# Patient Record
Sex: Female | Born: 1975 | Race: White | Hispanic: No | Marital: Single | Smoking: Never smoker
Health system: Southern US, Community
[De-identification: ages and names within clinical notes are randomized; demographics above are authoritative.]

## PROBLEM LIST (undated history)

## (undated) DIAGNOSIS — I10 Essential (primary) hypertension: Secondary | ICD-10-CM

## (undated) DIAGNOSIS — F32A Depression, unspecified: Secondary | ICD-10-CM

## (undated) DIAGNOSIS — G4733 Obstructive sleep apnea (adult) (pediatric): Secondary | ICD-10-CM

## (undated) DIAGNOSIS — K219 Gastro-esophageal reflux disease without esophagitis: Secondary | ICD-10-CM

## (undated) DIAGNOSIS — M1990 Unspecified osteoarthritis, unspecified site: Secondary | ICD-10-CM

## (undated) HISTORY — PX: HERNIA REPAIR: SHX51

## (undated) HISTORY — PX: LAPAROSCOPIC GASTRIC SLEEVE RESECTION: SHX5895

## (undated) HISTORY — DX: Obstructive sleep apnea (adult) (pediatric): G47.33

## (undated) HISTORY — PX: CARDIAC CATHETERIZATION: SHX172

---

## 2004-09-12 DIAGNOSIS — K219 Gastro-esophageal reflux disease without esophagitis: Secondary | ICD-10-CM | POA: Insufficient documentation

## 2007-09-08 DIAGNOSIS — G2581 Restless legs syndrome: Secondary | ICD-10-CM | POA: Insufficient documentation

## 2010-08-04 HISTORY — PX: BACK SURGERY: SHX140

## 2020-12-15 DIAGNOSIS — R079 Chest pain, unspecified: Secondary | ICD-10-CM

## 2021-02-13 DIAGNOSIS — Z8249 Family history of ischemic heart disease and other diseases of the circulatory system: Secondary | ICD-10-CM | POA: Insufficient documentation

## 2021-06-10 DIAGNOSIS — Z9889 Other specified postprocedural states: Secondary | ICD-10-CM | POA: Insufficient documentation

## 2021-12-12 ENCOUNTER — Emergency Department (HOSPITAL_COMMUNITY)
Admission: EM | Admit: 2021-12-12 | Discharge: 2021-12-12 | Disposition: A | Discharge status: Home / Self Care | Payer: No Typology Code available for payment source | Attending: Emergency Medicine | Admitting: Emergency Medicine

## 2021-12-12 ENCOUNTER — Emergency Department (HOSPITAL_COMMUNITY): Payer: No Typology Code available for payment source

## 2021-12-12 ENCOUNTER — Other Ambulatory Visit: Payer: Self-pay

## 2021-12-12 ENCOUNTER — Encounter (HOSPITAL_COMMUNITY): Payer: Self-pay

## 2021-12-12 DIAGNOSIS — S52572A Other intraarticular fracture of lower end of left radius, initial encounter for closed fracture: Secondary | ICD-10-CM | POA: Diagnosis not present

## 2021-12-12 DIAGNOSIS — S52612A Displaced fracture of left ulna styloid process, initial encounter for closed fracture: Secondary | ICD-10-CM | POA: Diagnosis not present

## 2021-12-12 DIAGNOSIS — M25572 Pain in left ankle and joints of left foot: Secondary | ICD-10-CM | POA: Insufficient documentation

## 2021-12-12 DIAGNOSIS — M542 Cervicalgia: Secondary | ICD-10-CM | POA: Diagnosis not present

## 2021-12-12 DIAGNOSIS — W19XXXA Unspecified fall, initial encounter: Secondary | ICD-10-CM

## 2021-12-12 DIAGNOSIS — M25552 Pain in left hip: Secondary | ICD-10-CM | POA: Diagnosis not present

## 2021-12-12 DIAGNOSIS — S59912A Unspecified injury of left forearm, initial encounter: Secondary | ICD-10-CM | POA: Diagnosis present

## 2021-12-12 HISTORY — DX: Unspecified osteoarthritis, unspecified site: M19.90

## 2021-12-12 HISTORY — DX: Essential (primary) hypertension: I10

## 2021-12-12 MED ORDER — HYDROMORPHONE HCL 1 MG/ML IJ SOLN
1.0000 mg | Freq: Once | INTRAMUSCULAR | Status: AC
  Administered 2021-12-12: 1 mg via INTRAVENOUS
  Filled 2021-12-12: qty 1

## 2021-12-12 MED ORDER — KETOROLAC TROMETHAMINE 15 MG/ML IJ SOLN
15.0000 mg | Freq: Once | INTRAMUSCULAR | Status: AC
  Administered 2021-12-12: 15 mg via INTRAVENOUS
  Filled 2021-12-12: qty 1

## 2021-12-12 MED ORDER — LIDOCAINE HCL (PF) 1 % IJ SOLN
30.0000 mL | Freq: Once | INTRAMUSCULAR | Status: AC
  Administered 2021-12-12: 30 mL
  Filled 2021-12-12: qty 30

## 2021-12-12 MED ORDER — HYDROMORPHONE HCL 1 MG/ML IJ SOLN
0.5000 mg | Freq: Once | INTRAMUSCULAR | Status: AC
  Administered 2021-12-12: 0.5 mg via INTRAVENOUS
  Filled 2021-12-12: qty 1

## 2021-12-12 MED ORDER — HYDROMORPHONE HCL 1 MG/ML IJ SOLN: 1.0000 mg | Freq: Once | INTRAMUSCULAR | Status: DC
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
## 2021-12-12 NOTE — Discharge Instructions (Addendum)
Please use Tylenol for pain.  You may use 1000 mg of Tylenol every 6 hours.  Not to exceed 4 g of Tylenol within 24 hours.   ? ?You can use the hydrocodone that you have at home for breakthrough pain. ? ?Please call the hand surgeon whose contact information I am providing first thing in the morning or contact the surgeon whom you are already in touch with for your ankle fracture to see if they also have a hand surgeon on staff. ? ?

## 2021-12-12 NOTE — ED Notes (Signed)
Discharge instructions reviewed, questions answered. Sling and walking boot in place. Rx education provided. Pt states understanding and no further questions. Pt wheeled outside upon discharge. No s/s of distress noted. ?
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
## 2021-12-16 ENCOUNTER — Encounter (HOSPITAL_BASED_OUTPATIENT_CLINIC_OR_DEPARTMENT_OTHER): Payer: Self-pay | Admitting: Orthopedic Surgery

## 2021-12-16 ENCOUNTER — Other Ambulatory Visit: Payer: Self-pay | Admitting: Orthopedic Surgery

## 2021-12-16 ENCOUNTER — Other Ambulatory Visit: Payer: Self-pay

## 2021-12-17 NOTE — Progress Notes (Signed)

## 2021-12-19 ENCOUNTER — Encounter (HOSPITAL_BASED_OUTPATIENT_CLINIC_OR_DEPARTMENT_OTHER): Payer: Self-pay | Admitting: Orthopedic Surgery

## 2021-12-19 ENCOUNTER — Ambulatory Visit (HOSPITAL_BASED_OUTPATIENT_CLINIC_OR_DEPARTMENT_OTHER): Payer: No Typology Code available for payment source

## 2021-12-19 ENCOUNTER — Other Ambulatory Visit: Payer: Self-pay

## 2021-12-19 ENCOUNTER — Ambulatory Visit (HOSPITAL_BASED_OUTPATIENT_CLINIC_OR_DEPARTMENT_OTHER): Payer: No Typology Code available for payment source | Admitting: Anesthesiology

## 2021-12-19 ENCOUNTER — Encounter (HOSPITAL_BASED_OUTPATIENT_CLINIC_OR_DEPARTMENT_OTHER)
Admission: RE | Disposition: A | Discharge status: Home / Self Care | Payer: Self-pay | Source: Home / Self Care | Attending: Orthopedic Surgery

## 2021-12-19 ENCOUNTER — Ambulatory Visit (HOSPITAL_BASED_OUTPATIENT_CLINIC_OR_DEPARTMENT_OTHER)
Admission: RE | Admit: 2021-12-19 | Discharge: 2021-12-19 | Disposition: A | Discharge status: Home / Self Care | Payer: No Typology Code available for payment source | Attending: Orthopedic Surgery | Admitting: Orthopedic Surgery

## 2021-12-19 DIAGNOSIS — Z79899 Other long term (current) drug therapy: Secondary | ICD-10-CM | POA: Diagnosis not present

## 2021-12-19 DIAGNOSIS — M19012 Primary osteoarthritis, left shoulder: Secondary | ICD-10-CM | POA: Diagnosis not present

## 2021-12-19 DIAGNOSIS — Z01818 Encounter for other preprocedural examination: Secondary | ICD-10-CM

## 2021-12-19 DIAGNOSIS — Z6841 Body Mass Index (BMI) 40.0 and over, adult: Secondary | ICD-10-CM | POA: Diagnosis not present

## 2021-12-19 DIAGNOSIS — K219 Gastro-esophageal reflux disease without esophagitis: Secondary | ICD-10-CM | POA: Insufficient documentation

## 2021-12-19 DIAGNOSIS — I10 Essential (primary) hypertension: Secondary | ICD-10-CM | POA: Insufficient documentation

## 2021-12-19 DIAGNOSIS — S52572A Other intraarticular fracture of lower end of left radius, initial encounter for closed fracture: Secondary | ICD-10-CM | POA: Insufficient documentation

## 2021-12-19 HISTORY — DX: Depression, unspecified: F32.A

## 2021-12-19 HISTORY — PX: FLEXOR TENOTOMY: SHX6342

## 2021-12-19 HISTORY — PX: OPEN REDUCTION INTERNAL FIXATION (ORIF) DISTAL RADIAL FRACTURE: SHX5989

## 2021-12-19 HISTORY — DX: Gastro-esophageal reflux disease without esophagitis: K21.9

## 2021-12-19 SURGERY — OPEN REDUCTION INTERNAL FIXATION (ORIF) DISTAL RADIUS FRACTURE
Anesthesia: Monitor Anesthesia Care | Site: Wrist | Laterality: Left

## 2021-12-19 MED ORDER — FENTANYL CITRATE (PF) 100 MCG/2ML IJ SOLN
100.0000 ug | Freq: Once | INTRAMUSCULAR | Status: AC
  Administered 2021-12-19: 50 ug via INTRAVENOUS

## 2021-12-19 MED ORDER — FENTANYL CITRATE (PF) 100 MCG/2ML IJ SOLN
INTRAMUSCULAR | Status: AC
  Filled 2021-12-19: qty 2

## 2021-12-19 MED ORDER — FENTANYL CITRATE (PF) 100 MCG/2ML IJ SOLN
INTRAMUSCULAR | Status: AC
Start: 2021-12-19 — End: ?
  Filled 2021-12-19: qty 2

## 2021-12-19 MED ORDER — CLONIDINE HCL (ANALGESIA) 100 MCG/ML EP SOLN
EPIDURAL | Status: DC | PRN
  Administered 2021-12-19: 100 ug

## 2021-12-19 MED ORDER — FENTANYL CITRATE (PF) 100 MCG/2ML IJ SOLN
25.0000 ug | INTRAMUSCULAR | Status: DC | PRN
  Administered 2021-12-19: 25 ug via INTRAVENOUS

## 2021-12-19 MED ORDER — MIDAZOLAM HCL 2 MG/2ML IJ SOLN
INTRAMUSCULAR | Status: AC
  Filled 2021-12-19: qty 2

## 2021-12-19 MED ORDER — ONDANSETRON HCL 4 MG/2ML IJ SOLN
INTRAMUSCULAR | Status: DC | PRN
  Administered 2021-12-19: 4 mg via INTRAVENOUS

## 2021-12-19 MED ORDER — LACTATED RINGERS IV SOLN: INTRAVENOUS | Status: DC

## 2021-12-19 MED ORDER — OXYCODONE HCL 5 MG/5ML PO SOLN: 5.0000 mg | Freq: Once | ORAL | Status: DC | PRN

## 2021-12-19 MED ORDER — DROPERIDOL 2.5 MG/ML IJ SOLN: 0.6250 mg | Freq: Once | INTRAMUSCULAR | Status: DC | PRN

## 2021-12-19 MED ORDER — OXYCODONE-ACETAMINOPHEN 5-325 MG PO TABS: ORAL_TABLET | ORAL | 0 refills | Status: DC

## 2021-12-19 MED ORDER — MIDAZOLAM HCL 5 MG/5ML IJ SOLN
INTRAMUSCULAR | Status: DC | PRN
  Administered 2021-12-19: 2 mg via INTRAVENOUS

## 2021-12-19 MED ORDER — 0.9 % SODIUM CHLORIDE (POUR BTL) OPTIME
TOPICAL | Status: DC | PRN
  Administered 2021-12-19: 90 mL

## 2021-12-19 MED ORDER — OXYCODONE HCL 5 MG PO TABS: 5.0000 mg | ORAL_TABLET | Freq: Once | ORAL | Status: DC | PRN

## 2021-12-19 MED ORDER — ACETAMINOPHEN 500 MG PO TABS
ORAL_TABLET | ORAL | Status: AC
Start: 2021-12-19 — End: ?
  Filled 2021-12-19: qty 2

## 2021-12-19 MED ORDER — PROPOFOL 10 MG/ML IV BOLUS
INTRAVENOUS | Status: DC | PRN
  Administered 2021-12-19: 50 mg via INTRAVENOUS

## 2021-12-19 MED ORDER — BUPIVACAINE-EPINEPHRINE (PF) 0.5% -1:200000 IJ SOLN
INTRAMUSCULAR | Status: DC | PRN
  Administered 2021-12-19: 30 mL via PERINEURAL

## 2021-12-19 MED ORDER — PROPOFOL 500 MG/50ML IV EMUL
INTRAVENOUS | Status: DC | PRN
  Administered 2021-12-19: 50 ug/kg/min via INTRAVENOUS

## 2021-12-19 MED ORDER — DEXMEDETOMIDINE (PRECEDEX) IN NS 20 MCG/5ML (4 MCG/ML) IV SYRINGE
PREFILLED_SYRINGE | INTRAVENOUS | Status: DC | PRN
  Administered 2021-12-19: 12 ug via INTRAVENOUS

## 2021-12-19 MED ORDER — FENTANYL CITRATE (PF) 100 MCG/2ML IJ SOLN
INTRAMUSCULAR | Status: DC | PRN
  Administered 2021-12-19: 25 ug via INTRAVENOUS
  Administered 2021-12-19: 50 ug via INTRAVENOUS
  Administered 2021-12-19: 25 ug via INTRAVENOUS

## 2021-12-19 MED ORDER — CEFAZOLIN SODIUM-DEXTROSE 2-4 GM/100ML-% IV SOLN
2.0000 g | INTRAVENOUS | Status: AC
  Administered 2021-12-19: 2 g via INTRAVENOUS

## 2021-12-19 MED ORDER — ACETAMINOPHEN 500 MG PO TABS
1000.0000 mg | ORAL_TABLET | Freq: Once | ORAL | Status: AC
  Administered 2021-12-19: 1000 mg via ORAL

## 2021-12-19 MED ORDER — MIDAZOLAM HCL 2 MG/2ML IJ SOLN
2.0000 mg | Freq: Once | INTRAMUSCULAR | Status: AC
  Administered 2021-12-19: 1 mg via INTRAVENOUS

## 2021-12-19 MED ORDER — LIDOCAINE 2% (20 MG/ML) 5 ML SYRINGE
INTRAMUSCULAR | Status: AC
  Filled 2021-12-19: qty 5

## 2021-12-19 MED ORDER — CEFAZOLIN SODIUM-DEXTROSE 2-4 GM/100ML-% IV SOLN
INTRAVENOUS | Status: AC
  Filled 2021-12-19: qty 100

## 2021-12-19 SURGICAL SUPPLY — 65 items
APL PRP STRL LF DISP 70% ISPRP (MISCELLANEOUS) ×1
BIT DRILL 2.0 LNG QUCK RELEASE (BIT) IMPLANT
BIT DRILL QC 2.8X5 (BIT) ×1 IMPLANT
BLADE SURG 15 STRL LF DISP TIS (BLADE) ×2 IMPLANT
BLADE SURG 15 STRL SS (BLADE) ×4
BNDG CMPR 9X4 STRL LF SNTH (GAUZE/BANDAGES/DRESSINGS) ×1
BNDG ELASTIC 3X5.8 VLCR STR LF (GAUZE/BANDAGES/DRESSINGS) ×2 IMPLANT
BNDG ESMARK 4X9 LF (GAUZE/BANDAGES/DRESSINGS) ×2 IMPLANT
BNDG GAUZE ELAST 4 BULKY (GAUZE/BANDAGES/DRESSINGS) ×2 IMPLANT
BNDG PLASTER X FAST 3X3 WHT LF (CAST SUPPLIES) ×20 IMPLANT
BNDG PLSTR 9X3 FST ST WHT (CAST SUPPLIES) ×10
CHLORAPREP W/TINT 26 (MISCELLANEOUS) ×2 IMPLANT
CORD BIPOLAR FORCEPS 12FT (ELECTRODE) ×2 IMPLANT
COVER BACK TABLE 60X90IN (DRAPES) ×2 IMPLANT
COVER MAYO STAND STRL (DRAPES) ×2 IMPLANT
CUFF TOURN SGL QUICK 18X4 (TOURNIQUET CUFF) IMPLANT
CUFF TOURN SGL QUICK 24 (TOURNIQUET CUFF)
CUFF TRNQT CYL 24X4X16.5-23 (TOURNIQUET CUFF) IMPLANT
DRAPE EXTREMITY T 121X128X90 (DISPOSABLE) ×2 IMPLANT
DRAPE OEC MINIVIEW 54X84 (DRAPES) ×2 IMPLANT
DRAPE SURG 17X23 STRL (DRAPES) ×1 IMPLANT
DRILL 2.0 LNG QUICK RELEASE (BIT) ×2
GAUZE SPONGE 4X4 12PLY STRL (GAUZE/BANDAGES/DRESSINGS) ×2 IMPLANT
GAUZE XEROFORM 1X8 LF (GAUZE/BANDAGES/DRESSINGS) ×2 IMPLANT
GLOVE BIO SURGEON STRL SZ7 (GLOVE) ×1 IMPLANT
GLOVE BIO SURGEON STRL SZ7.5 (GLOVE) ×3 IMPLANT
GLOVE BIOGEL PI IND STRL 7.0 (GLOVE) IMPLANT
GLOVE BIOGEL PI IND STRL 8 (GLOVE) ×1 IMPLANT
GLOVE BIOGEL PI IND STRL 8.5 (GLOVE) IMPLANT
GLOVE BIOGEL PI INDICATOR 7.0 (GLOVE) ×3
GLOVE BIOGEL PI INDICATOR 8 (GLOVE) ×1
GLOVE BIOGEL PI INDICATOR 8.5 (GLOVE) ×1
GLOVE SURG ORTHO 8.0 STRL STRW (GLOVE) ×2 IMPLANT
GLOVE SURG SS PI 7.0 STRL IVOR (GLOVE) ×1 IMPLANT
GOWN STRL REUS W/ TWL LRG LVL3 (GOWN DISPOSABLE) ×1 IMPLANT
GOWN STRL REUS W/TWL LRG LVL3 (GOWN DISPOSABLE) ×4
GOWN STRL REUS W/TWL XL LVL3 (GOWN DISPOSABLE) ×3 IMPLANT
GUIDEWIRE ORTHO 0.054X6 (WIRE) ×3 IMPLANT
MAT PREVALON FULL STRYKER (MISCELLANEOUS) ×1 IMPLANT
NDL HYPO 25X1 1.5 SAFETY (NEEDLE) IMPLANT
NEEDLE HYPO 25X1 1.5 SAFETY (NEEDLE) IMPLANT
NS IRRIG 1000ML POUR BTL (IV SOLUTION) ×2 IMPLANT
PACK BASIN DAY SURGERY FS (CUSTOM PROCEDURE TRAY) ×2 IMPLANT
PAD CAST 3X4 CTTN HI CHSV (CAST SUPPLIES) ×1 IMPLANT
PADDING CAST COTTON 3X4 STRL (CAST SUPPLIES) ×2
PLATE PROX NARROW LEFT (Plate) ×1 IMPLANT
SCREW CORT FT 20X2.3XLCK HEX (Screw) IMPLANT
SCREW CORT FT 22X2.3XLCK HEX (Screw) IMPLANT
SCREW CORTICAL LOCKING 2.3X20M (Screw) ×8 IMPLANT
SCREW CORTICAL LOCKING 2.3X22M (Screw) ×6 IMPLANT
SCREW FX20X2.3XSMTH LCK NS CRT (Screw) IMPLANT
SCREW FX22X2.3XLCK SMTH NS CRT (Screw) IMPLANT
SCREW HEXALOBE NON-LOCK 3.5X14 (Screw) ×1 IMPLANT
SCREW NLCKG 13 3.5X13 HEXA (Screw) IMPLANT
SCREW NON-LOCK 3.5X13 (Screw) ×2 IMPLANT
SCREW NONLOCK HEX 3.5X12 (Screw) ×1 IMPLANT
SLEEVE SCD COMPRESS KNEE MED (STOCKING) IMPLANT
SLING ARM FOAM STRAP LRG (SOFTGOODS) ×1 IMPLANT
STOCKINETTE 4X48 STRL (DRAPES) ×2 IMPLANT
SUT ETHILON 4 0 PS 2 18 (SUTURE) ×3 IMPLANT
SUT VICRYL 4-0 PS2 18IN ABS (SUTURE) ×2 IMPLANT
SYR BULB EAR ULCER 3OZ GRN STR (SYRINGE) ×2 IMPLANT
SYR CONTROL 10ML LL (SYRINGE) IMPLANT
TOWEL GREEN STERILE FF (TOWEL DISPOSABLE) ×4 IMPLANT
UNDERPAD 30X36 HEAVY ABSORB (UNDERPADS AND DIAPERS) ×2 IMPLANT

## 2021-12-19 NOTE — Discharge Instructions (Addendum)
[facility] Instructions Hand Surgery  Wound Care: Keep your hand elevated above the level of your heart.  Do not allow it to dangle by your side.  Keep the dressing dry and do not remove it unless your doctor advises you to do so.  He will usually change it at the time of your post-op visit.  Moving your fingers is advised to stimulate circulation but will depend on the site of your surgery.  If you have a splint applied, your doctor will advise you regarding movement.  Activity: Do not drive or operate machinery today.  Rest today and then you may return to your normal activity and work as indicated by your physician.  Diet:  Drink liquids today or eat a light diet.  You may resume a regular diet tomorrow.    General expectations: Pain for two to three days. Fingers may become slightly swollen.  Call your doctor if any of the following occur: Severe pain not relieved by pain medication. Elevated temperature. Dressing soaked with blood. Inability to move fingers. Erich or bluish color to fingers.    Post Anesthesia Home Care Instructions  Activity: Get plenty of rest for the remainder of the day. A responsible individual must stay with you for 24 hours following the procedure.  For the next 24 hours, DO NOT: -Drive a car -Paediatric nurse -Drink alcoholic beverages -Take any medication unless instructed by your physician -Make any legal decisions or sign important papers.  Meals: Start with liquid foods such as gelatin or soup. Progress to regular foods as tolerated. Avoid greasy, spicy, heavy foods. If nausea and/or vomiting occur, drink only clear liquids until the nausea and/or vomiting subsides. Call your physician if vomiting continues.  Special Instructions/Symptoms: Your throat may feel dry or sore from the anesthesia or the breathing tube placed in your throat during surgery. If this causes discomfort, gargle with warm salt water. The discomfort should disappear  within 24 hours.  If you had a scopolamine patch placed behind your ear for the management of post- operative nausea and/or vomiting:  1. The medication in the patch is effective for 72 hours, after which it should be removed.  Wrap patch in a tissue and discard in the trash. Wash hands thoroughly with soap and water. 2. You may remove the patch earlier than 72 hours if you experience unpleasant side effects which may include dry mouth, dizziness or visual disturbances. 3. Avoid touching the patch. Wash your hands with soap and water after contact with the patch.   Regional Anesthesia Blocks  1. Numbness or the inability to move the "blocked" extremity may last from 3-48 hours after placement. The length of time depends on the medication injected and your individual response to the medication. If the numbness is not going away after 48 hours, call your surgeon.  2. The extremity that is blocked will need to be protected until the numbness is gone and the  Strength has returned. Because you cannot feel it, you will need to take extra care to avoid injury. Because it may be weak, you may have difficulty moving it or using it. You may not know what position it is in without looking at it while the block is in effect.  3. For blocks in the legs and feet, returning to weight bearing and walking needs to be done carefully. You will need to wait until the numbness is entirely gone and the strength has returned. You should be able to move your leg  and foot normally before you try and bear weight or walk. You will need someone to be with you when you first try to ensure you do not fall and possibly risk injury.  4. Bruising and tenderness at the needle site are common side effects and will resolve in a few days.  5. Persistent numbness or new problems with movement should be communicated to the surgeon or the [facility] [phone] [place] [phone]).      Next dose  of Tylenol can be given at 4:15pm if needed.
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
## 2021-12-19 NOTE — Progress Notes (Signed)
Assisted Dr. Howze with left, supraclavicular, ultrasound guided block. Side rails up, monitors on throughout procedure. See vital signs in flow sheet. Tolerated Procedure well. ?

## 2021-12-19 NOTE — Transfer of Care (Signed)
Immediate Anesthesia Transfer of Care Note  Patient: Djeneba Barsch  Procedure(s) Performed: OPEN REDUCTION INTERNAL FIXATION (ORIF) LEFT DISTAL RADIUS FRACTURE (Left: Wrist) LEFT BRACHIORADIALIS RELEASE (Left)  Patient Location: PACU  Anesthesia Type:MAC combined with regional for post-op pain  Level of Consciousness: awake  Airway & Oxygen Therapy: Patient Spontanous Breathing and Patient connected to face mask oxygen  Post-op Assessment: Report given to RN and Post -op Vital signs reviewed and stable  Post vital signs: Reviewed and stable  Last Vitals:  Vitals Value Taken Time  BP    Temp    Pulse 85 12/19/21 1233  Resp 25 12/19/21 1233  SpO2 97 % 12/19/21 1233  Vitals shown include unvalidated device data.  Last Pain:  Vitals:   12/19/21 0940  PainSc: 4       Patients Stated Pain Goal: 8 (93/71/69 [id number])  Complications: No notable events documented.

## 2021-12-19 NOTE — Op Note (Signed)
I assisted Surgeon(s) and Role:    * Betha Loa, MD - Primary    Cindee Salt, MD - Assisting on the Procedure(s): OPEN REDUCTION INTERNAL FIXATION (ORIF) LEFT DISTAL RADIUS FRACTURE LEFT BRACHIORADIALIS RELEASE on 12/19/2021.  I provided assistance on this case as follows: Set up, approach, identification and release of the brachial radialis tendon, identification of the fracture, debridement of the fracture, reduction of the fracture, stabilization of the fracture with plate and screws, which are the wound with application of the dressing and splint.  Electronically signed by: Cindee Salt, MD Date: 12/19/2021 Time: 12:26 PM
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
## 2021-12-19 NOTE — Anesthesia Procedure Notes (Signed)
Anesthesia Regional Block: Supraclavicular block   Pre-Anesthetic Checklist: , timeout performed,  Correct Patient, Correct Site, Correct Laterality,  Correct Procedure, Correct Position, site marked,  Risks and benefits discussed,  Pre-op evaluation,  At surgeon's request and post-op pain management  Laterality: Left  Prep: Maximum Sterile Barrier Precautions used, chloraprep       Needles:  Injection technique: Single-shot  Needle Type: Echogenic Stimulator Needle     Needle Length: 9cm  Needle Gauge: 22     Additional Needles:   Procedures:,,,, ultrasound used (permanent image in chart),,    Narrative:  Start time: 12/19/2021 9:58 AM End time: 12/19/2021 10:01 AM Injection made incrementally with aspirations every 5 mL.  Performed by: Personally  Anesthesiologist: Kaylyn Layer, MD  Additional Notes: Risks, benefits, and alternative discussed. Patient gave consent for procedure. Patient prepped and draped in sterile fashion. Sedation administered, patient remains easily responsive to voice. Relevant anatomy identified with ultrasound guidance. Local anesthetic given in 5cc increments with no signs or symptoms of intravascular injection. No pain or paraesthesias with injection. Patient monitored throughout procedure with signs of LAST or immediate complications. Tolerated well. Ultrasound image placed in chart.  Amalia Greenhouse, MD

## 2021-12-19 NOTE — Anesthesia Preprocedure Evaluation (Addendum)
Anesthesia Evaluation  Patient identified by MRN, date of birth, ID band Patient awake    Reviewed: Allergy & Precautions, NPO status , Patient's Chart, lab work & pertinent test results, reviewed documented beta blocker date and time   History of Anesthesia Complications Negative for: history of anesthetic complications  Airway Mallampati: II  TM Distance: >3 FB Neck ROM: Full    Dental no notable dental hx.    Pulmonary neg pulmonary ROS,    Pulmonary exam normal        Cardiovascular hypertension, Pt. on medications and Pt. on home beta blockers Normal cardiovascular exam     Neuro/Psych Depression negative neurological ROS     GI/Hepatic Neg liver ROS, GERD  Medicated and Controlled,S/p gastric sleeve   Endo/Other  Morbid obesity  Renal/GU negative Renal ROS  negative genitourinary   Musculoskeletal  (+) Arthritis , LEFT DISTAL RADIUS FRACTURE   Abdominal   Peds  Hematology negative hematology ROS (+)   Anesthesia Other Findings Day of surgery medications reviewed with patient.  Reproductive/Obstetrics negative OB ROS                            Anesthesia Physical Anesthesia Plan  ASA: 3  Anesthesia Plan: MAC and Regional   Post-op Pain Management: Tylenol PO (pre-op)*   Induction:   PONV Risk Score and Plan: 2 and Treatment may vary due to age or medical condition, Midazolam, Ondansetron and Propofol infusion  Airway Management Planned: Natural Airway and Simple Face Mask  Additional Equipment: None  Intra-op Plan:   Post-operative Plan:   Informed Consent: I have reviewed the patients History and Physical, chart, labs and discussed the procedure including the risks, benefits and alternatives for the proposed anesthesia with the patient or authorized representative who has indicated his/her understanding and acceptance.       Plan Discussed with: CRNA  Anesthesia  Plan Comments:        Anesthesia Quick Evaluation

## 2021-12-20 ENCOUNTER — Encounter (HOSPITAL_BASED_OUTPATIENT_CLINIC_OR_DEPARTMENT_OTHER): Payer: Self-pay | Admitting: Orthopedic Surgery

## 2021-12-20 NOTE — Anesthesia Postprocedure Evaluation (Signed)
Anesthesia Post Note  Patient: Natalie Hoffman  Procedure(s) Performed: OPEN REDUCTION INTERNAL FIXATION (ORIF) LEFT DISTAL RADIUS FRACTURE (Left: Wrist) LEFT BRACHIORADIALIS RELEASE (Left: Wrist)     Patient location during evaluation: PACU Anesthesia Type: Regional and MAC Level of consciousness: awake and alert Pain management: pain level controlled Vital Signs Assessment: post-procedure vital signs reviewed and stable Respiratory status: spontaneous breathing, nonlabored ventilation, respiratory function stable and patient connected to nasal cannula oxygen Cardiovascular status: stable and blood pressure returned to baseline Postop Assessment: no apparent nausea or vomiting Anesthetic complications: no   No notable events documented.  Last Vitals:  Vitals:   12/19/21 1315 12/19/21 1332  BP: 128/83 (!) 152/90  Pulse: 76 69  Resp: 20 16  Temp:  36.5 C  SpO2: 93% 95%    Last Pain:  Vitals:   12/19/21 1332  PainSc: 0-No pain   Pain Goal: Patients Stated Pain Goal: 8 (12/19/21 0940)                 [place]

## 2021-12-31 ENCOUNTER — Encounter: Payer: Self-pay | Admitting: Physician Assistant

## 2021-12-31 ENCOUNTER — Ambulatory Visit (INDEPENDENT_AMBULATORY_CARE_PROVIDER_SITE_OTHER): Payer: No Typology Code available for payment source | Admitting: Physician Assistant

## 2021-12-31 VITALS — BP 162/110 | HR 87 | Temp 97.4°F | Ht 62.0 in | Wt 252.0 lb

## 2021-12-31 DIAGNOSIS — R899 Unspecified abnormal finding in specimens from other organs, systems and tissues: Secondary | ICD-10-CM | POA: Diagnosis not present

## 2021-12-31 DIAGNOSIS — F341 Dysthymic disorder: Secondary | ICD-10-CM | POA: Insufficient documentation

## 2021-12-31 DIAGNOSIS — F339 Major depressive disorder, recurrent, unspecified: Secondary | ICD-10-CM

## 2021-12-31 DIAGNOSIS — I10 Essential (primary) hypertension: Secondary | ICD-10-CM

## 2021-12-31 MED ORDER — LOSARTAN POTASSIUM 50 MG PO TABS: 50.0000 mg | ORAL_TABLET | Freq: Every day | ORAL | 1 refills | Status: DC

## 2021-12-31 MED ORDER — VENLAFAXINE HCL ER 37.5 MG PO CP24: 37.5000 mg | ORAL_CAPSULE | Freq: Every day | ORAL | 1 refills | Status: DC

## 2021-12-31 NOTE — Progress Notes (Signed)
New Patient Office Visit  Subjective:  Patient ID: Natalie Hoffman, female    DOB: [birth year]  Age: 45 y.o. MRN: [id number]  CC:   Chief Complaint  Patient presents with   Depression hypertension    HPI Natalie Hoffman presents for hypertension.  She states she has had hypertension for several years.  She is supposed to be taking labatelol $RemoveBeforeDE'200mg'cLkwTsjAAcGOtuW$  pm and $Remo'100mg'RvSWC$  in am but she states she never takes the morning dose and bp remains elevated She denies chest pain/sob/edema  Pt with complaints of depressive symptoms.  She scored 15 on PHQ - she has tried several medications in the past including lexapro, paxil, cymbalta, amitriptyline and wellbutrin.  Cannot recall if any of these medications helped. States that she feels se has had depressed mood and crying at times  Pt seeing ortho at this time for right ankle fracture on 5/5 from a fall and then had another fall on 5/11 and fractured left wrist  Pt states awhile back she was told she may need thyroid panel rechecked for possible abnormality and be checked for thyroid disorder - family history of hypothyroidism  Past Medical History:  Diagnosis Date   Arthritis    Depression    GERD (gastroesophageal reflux disease)    Hypertension     Past Surgical History:  Procedure Laterality Date   BACK SURGERY  2012   CARDIAC CATHETERIZATION     negative study no CAD   CESAREAN SECTION     FLEXOR TENOTOMY  Left 12/19/2021   Procedure: LEFT BRACHIORADIALIS RELEASE;  Surgeon: Leanora Cover, MD;  Location: [place];  Service: Orthopedics;  Laterality: Left;  Block   HERNIA REPAIR     LAPAROSCOPIC GASTRIC SLEEVE RESECTION     OPEN REDUCTION INTERNAL FIXATION (ORIF) DISTAL RADIAL FRACTURE Left 12/19/2021   Procedure: OPEN REDUCTION INTERNAL FIXATION (ORIF) LEFT DISTAL RADIUS FRACTURE;  Surgeon: Leanora Cover, MD;  Location: [place];  Service: Orthopedics;  Laterality: Left;  Block    Family  History  Problem Relation Age of Onset   Arthritis Mother    Hypertension Mother    Cancer Mother        VULVAR CANCER   Diabetes Mother    Hyperlipidemia Mother    Hypothyroidism Mother    Arthritis Father    Hypertension Father    Hyperlipidemia Father    Breast cancer Maternal Aunt    Heart attack Paternal Grandfather     Social History   Socioeconomic History   Marital status: Single    Spouse name: Not on file   Number of children: 1   Years of education: Not on file   Highest education level: Not on file  Occupational History   Not on file  Tobacco Use   Smoking status: Never   Smokeless tobacco: Never  Vaping Use   Vaping Use: Never used  Substance and Sexual Activity   Alcohol use: Yes    Comment: OCCASIONAL   Drug use: Never   Sexual activity: Not on file  Other Topics Concern   Not on file  Social History Narrative   Not on file   Social Determinants of Health   Financial Resource Strain: Not on file  Food Insecurity: Not on file  Transportation Needs: Not on file  Physical Activity: Not on file  Stress: Not on file  Social Connections: Not on file  Intimate Partner Violence: Not on file     Current Outpatient Medications:  cetirizine (ZYRTEC) 10 MG tablet, Take 10 mg by mouth daily., Disp: , Rfl:    clobetasol (TEMOVATE) 0.05 % external solution, Apply topically., Disp: , Rfl:    esomeprazole (NEXIUM) 40 MG capsule, Take 40 mg by mouth daily at 12 noon., Disp: , Rfl:    ferrous sulfate 325 (65 FE) MG EC tablet, Take 325 mg by mouth 3 (three) times daily with meals., Disp: , Rfl:    gabapentin (NEURONTIN) 300 MG capsule, Take by mouth., Disp: , Rfl:    Multiple Vitamins-Minerals (BARIATRIC MULTIVITAMINS/IRON) CAPS, Take by mouth., Disp: , Rfl:    pramipexole (MIRAPEX) 0.5 MG tablet, Take 0.5 mg by mouth 3 (three) times daily., Disp: , Rfl:    Ursodiol (URSO 250 PO), Take by mouth., Disp: , Rfl:    Vitamin D, Ergocalciferol, (DRISDOL) 1.25 MG  (50000 UNIT) CAPS capsule, Take 50,000 Units by mouth once a week., Disp: , Rfl:    losartan (COZAAR) 50 MG tablet, Take 1 tablet (50 mg total) by mouth daily., Disp: 30 tablet, Rfl: 1   oxyCODONE-acetaminophen (PERCOCET) 5-325 MG tablet, 1-2 tabs PO q6 hours prn pain (Patient not taking: Reported on 12/31/2021), Disp: 25 tablet, Rfl: 0   venlafaxine XR (EFFEXOR XR) 37.5 MG 24 hr capsule, Take 1 capsule (37.5 mg total) by mouth daily with breakfast., Disp: 30 capsule, Rfl: 1   Allergies  Allergen Reactions   Penicillins Rash    ROS CONSTITUTIONAL: Negative for chills, fatigue, fever,  E/N/T: Negative for ear pain, nasal congestion and sore throat.  CARDIOVASCULAR: Negative for chest pain, dizziness, palpitations and pedal edema.  RESPIRATORY: Negative for recent cough and dyspnea.  MSK: see HPI INTEGUMENTARY: Negative for rash.  PSYCHIATRIC:see HPI    Objective:    PHYSICAL EXAM:   VS: BP (!) 162/110 (BP Location: Right Arm, Patient Position: Sitting)   Pulse 87   Temp (!) 97.4 F (36.3 C) (Temporal)   Ht $R'5\' 2"'Pb$  (1.575 m)   Wt 252 lb (114.3 kg)   SpO2 99%   BMI 46.09 kg/m   GEN: Well nourished, well developed, in no acute distress -  Cardiac: RRR; no murmurs, rubs, or gallops,no edema -  Respiratory:  normal respiratory rate and pattern with no distress - normal breath sounds with no rales, rhonchi, wheezes or rubs MS: splint on left wrist --- supposed to have boot on right leg - not wearing Skin: warm and dry, no rash  Psych: euthymic mood, appropriate affect and demeanor    12/31/2021    2:47 PM  PHQ9 SCORE ONLY  PHQ-9 Total Score 15    BP (!) 162/110 (BP Location: Right Arm, Patient Position: Sitting)   Pulse 87   Temp (!) 97.4 F (36.3 C) (Temporal)   Ht $R'5\' 2"'Rp$  (1.575 m)   Wt 252 lb (114.3 kg)   SpO2 99%   BMI 46.09 kg/m  Wt Readings from Last 3 Encounters:  12/31/21 252 lb (114.3 kg)  12/19/21 257 lb 0.9 oz (116.6 kg)  12/12/21 250 lb (113.4 kg)      Health Maintenance Due  Topic Date Due   HIV Screening  Never done   Hepatitis C Screening  Never done   TETANUS/TDAP  Never done   PAP SMEAR-Modifier  Never done   COLONOSCOPY (Pts 45-34yrs Insurance coverage will need to be confirmed)  Never done    There are no preventive care reminders to display for this patient.  No results found for: TSH No results found for: WBC,  HGB, HCT, MCV, PLT No results found for: NA, K, CHLORIDE, CO2, GLUCOSE, BUN, CREATININE, BILITOT, ALKPHOS, AST, ALT, PROT, ALBUMIN, CALCIUM, ANIONGAP, EGFR, GFR No results found for: CHOL No results found for: HDL No results found for: LDLCALC No results found for: TRIG No results found for: CHOLHDL No results found for: HGBA1C    Assessment & Plan:   Problem List Items Addressed This Visit       Cardiovascular and Mediastinum   Essential hypertension - Primary   Relevant Medications   losartan (COZAAR) 50 MG tablet Stop labatelol   Other Relevant Orders   Thyroid Panel With TSH   Lipid Panel     Other   Depression, recurrent (HCC)   Relevant Medications   venlafaxine XR (EFFEXOR XR) 37.5 MG 24 hr capsule   Abnormal laboratory test   Relevant Orders   Thyroid Panel With TSH    Meds ordered this encounter  Medications   losartan (COZAAR) 50 MG tablet    Sig: Take 1 tablet (50 mg total) by mouth daily.    Dispense:  30 tablet    Refill:  1    Order Specific Question:   Supervising Provider    Answer:   Shelton Silvas   venlafaxine XR (EFFEXOR XR) 37.5 MG 24 hr capsule    Sig: Take 1 capsule (37.5 mg total) by mouth daily with breakfast.    Dispense:  30 capsule    Refill:  1    Order Specific Question:   Supervising Provider    AnswerShelton Silvas    Follow-up: Return for return 2 and 1/2 - 3 weeks for follow up.    SARA R Dillon Mcreynolds, PA-C

## 2022-01-01 ENCOUNTER — Other Ambulatory Visit: Payer: Self-pay | Admitting: Physician Assistant

## 2022-01-01 LAB — LIPID PANEL
Chol/HDL Ratio: 4.4 ratio (ref 0.0–4.4)
Cholesterol, Total: 250 mg/dL — ABNORMAL HIGH (ref 100–199)
HDL: 57 mg/dL (ref >39)
LDL Chol Calc (NIH): 149 mg/dL — ABNORMAL HIGH (ref 0–99)
Triglycerides: 242 mg/dL — ABNORMAL HIGH (ref 0–149)
VLDL Cholesterol Cal: 44 mg/dL — ABNORMAL HIGH (ref 5–40)

## 2022-01-01 LAB — THYROID PANEL WITH TSH
Free Thyroxine Index: 2 (ref 1.2–4.9)
T3 Uptake Ratio: 24 % (ref 24–39)
T4, Total: 8.4 ug/dL (ref 4.5–12.0)
TSH: 1.66 u[IU]/mL (ref 0.450–4.500)

## 2022-01-01 LAB — CARDIOVASCULAR RISK ASSESSMENT

## 2022-01-01 MED ORDER — ROSUVASTATIN CALCIUM 5 MG PO TABS: 5.0000 mg | ORAL_TABLET | Freq: Every day | ORAL | 0 refills | Status: DC

## 2022-01-21 ENCOUNTER — Encounter: Payer: Self-pay | Admitting: Physician Assistant

## 2022-01-21 ENCOUNTER — Ambulatory Visit (INDEPENDENT_AMBULATORY_CARE_PROVIDER_SITE_OTHER): Payer: No Typology Code available for payment source | Admitting: Physician Assistant

## 2022-01-21 VITALS — BP 134/98 | HR 92 | Temp 99.3°F | Resp 18 | Ht 62.0 in | Wt 248.8 lb

## 2022-01-21 DIAGNOSIS — I10 Essential (primary) hypertension: Secondary | ICD-10-CM

## 2022-01-21 DIAGNOSIS — F339 Major depressive disorder, recurrent, unspecified: Secondary | ICD-10-CM

## 2022-01-21 DIAGNOSIS — K130 Diseases of lips: Secondary | ICD-10-CM | POA: Insufficient documentation

## 2022-01-21 MED ORDER — LOSARTAN POTASSIUM 100 MG PO TABS: 100.0000 mg | ORAL_TABLET | Freq: Every day | ORAL | 3 refills | Status: DC

## 2022-01-21 MED ORDER — KETOCONAZOLE 2 % EX CREA: 1.0000 | TOPICAL_CREAM | Freq: Two times a day (BID) | CUTANEOUS | 1 refills | Status: DC

## 2022-01-21 NOTE — Progress Notes (Signed)
Subjective:  Patient ID: Natalie Hoffman, female    DOB: 1976-07-04  Age: 46 y.o. MRN: [id number]  Chief Complaint  Patient presents with   Hypertension   Depression    HPI  Pt in today for follow up of hypertension.  She was switched off of labetalol at last visit because she wanted to do a once daily medication and placed on losartan 50mg  qd - is tolerating medication - is not checking bp at home Denies chest pain/dyspnea  Pt with hyperlipidemia - was started on crestor 5mg  qd and tolerating medication  Pt with history of depression with anxiety - she scored a 10 on PHQ9 today down from 15 but unsure exactly if medication helping with symptoms - would like to try to continue medication for a few more weeks  Pt complains of rash on corner of mouth - red and irritating  Pt would like information about HPV vaccine Current Outpatient Medications on File Prior to Visit  Medication Sig Dispense Refill   cetirizine (ZYRTEC) 10 MG tablet Take 10 mg by mouth daily.     clobetasol (TEMOVATE) 0.05 % external solution Apply topically.     esomeprazole (NEXIUM) 40 MG capsule Take 40 mg by mouth daily at 12 noon.     ferrous sulfate 325 (65 FE) MG EC tablet Take 325 mg by mouth 3 (three) times daily with meals.     gabapentin (NEURONTIN) 300 MG capsule Take by mouth.     Multiple Vitamins-Minerals (BARIATRIC MULTIVITAMINS/IRON) CAPS Take by mouth.     oxyCODONE-acetaminophen (PERCOCET) 5-325 MG tablet 1-2 tabs PO q6 hours prn pain 25 tablet 0   pramipexole (MIRAPEX) 0.5 MG tablet Take 0.5 mg by mouth 3 (three) times daily.     rosuvastatin (CRESTOR) 5 MG tablet Take 1 tablet (5 mg total) by mouth daily. 90 tablet 0   Ursodiol (URSO 250 PO) Take by mouth.     venlafaxine XR (EFFEXOR XR) 37.5 MG 24 hr capsule Take 1 capsule (37.5 mg total) by mouth daily with breakfast. 30 capsule 1   Vitamin D, Ergocalciferol, (DRISDOL) 1.25 MG (50000 UNIT) CAPS capsule Take 50,000 Units by mouth once a  week.     No current facility-administered medications on file prior to visit.   Past Medical History:  Diagnosis Date   Arthritis    Depression    GERD (gastroesophageal reflux disease)    Hypertension    Past Surgical History:  Procedure Laterality Date   BACK SURGERY  2012   CARDIAC CATHETERIZATION     negative study no CAD   CESAREAN SECTION     FLEXOR TENOTOMY  Left 12/19/2021   Procedure: LEFT BRACHIORADIALIS RELEASE;  Surgeon: 2013, MD;  Location: [facility];  Service: Orthopedics;  Laterality: Left;  Block   HERNIA REPAIR     LAPAROSCOPIC GASTRIC SLEEVE RESECTION     OPEN REDUCTION INTERNAL FIXATION (ORIF) DISTAL RADIAL FRACTURE Left 12/19/2021   Procedure: OPEN REDUCTION INTERNAL FIXATION (ORIF) LEFT DISTAL RADIUS FRACTURE;  Surgeon: Betha Loa, MD;  Location: [facility];  Service: Orthopedics;  Laterality: Left;  Block    Family History  Problem Relation Age of Onset   Arthritis Mother    Hypertension Mother    Cancer Mother        VULVAR CANCER   Diabetes Mother    Hyperlipidemia Mother    Hypothyroidism Mother    Arthritis Father    Hypertension Father    Hyperlipidemia  Father    Breast cancer Maternal Aunt    Heart attack Paternal Grandfather    Social History   Socioeconomic History   Marital status: Single    Spouse name: Not on file   Number of children: 1   Years of education: Not on file   Highest education level: Not on file  Occupational History   Not on file  Tobacco Use   Smoking status: Never   Smokeless tobacco: Never  Vaping Use   Vaping Use: Never used  Substance and Sexual Activity   Alcohol use: Yes    Comment: OCCASIONAL   Drug use: Never   Sexual activity: Not on file  Other Topics Concern   Not on file  Social History Narrative   Not on file   Social Determinants of Health   Financial Resource Strain: Not on file  Food Insecurity: Not on file  Transportation Needs: Not on file   Physical Activity: Not on file  Stress: Not on file  Social Connections: Not on file    Review of Systems  CONSTITUTIONAL: has had some fatigue  CARDIOVASCULAR: Negative for chest pain, dizziness, palpitations and pedal edema.  RESPIRATORY: Negative for recent cough and dyspnea.  GASTROINTESTINAL: Negative for abdominal pain, acid reflux symptoms, constipation, diarrhea, nausea and vomiting.  MSK: Negative for arthralgias and myalgias.  INTEGUMENTARY: see HPI  PSYCHIATRIC: see HPI  Objective:  PHYSICAL EXAM:   VS: BP (!) 134/98   Pulse 92   Temp 99.3 F (37.4 C)   Resp 18   Ht 5\' 2"  (1.575 m)   Wt 248 lb 12.8 oz (112.9 kg)   SpO2 96%   BMI 45.51 kg/m  Recheck bp 128/92 GEN: Well nourished, well developed, in no acute distress   Cardiac: RRR; no murmurs, rubs, or gallops,no edema -  Respiratory:  normal respiratory rate and pattern with no distress - normal breath sounds with no rales, rhonchi, wheezes or rubs  Skin: red cracks both corners of mouth  Psych: euthymic mood, appropriate affect and demeanor     01/21/2022    2:56 PM 12/31/2021    2:47 PM  Depression screen PHQ 2/9  Decreased Interest 3 1  Down, Depressed, Hopeless 1 2  PHQ - 2 Score 4 3  Altered sleeping 2 2  Tired, decreased energy 2 3  Change in appetite 1 2  Feeling bad or failure about yourself  1 3  Trouble concentrating 0 1  Moving slowly or fidgety/restless 0 0  Suicidal thoughts 0 1  PHQ-9 Score 10 15  Difficult doing work/chores Somewhat difficult Somewhat difficult    Lab Results  Component Value Date   CHOL 250 (H) 12/31/2021   TRIG 242 (H) 12/31/2021   HDL 57 12/31/2021   LDLCALC 149 (H) 12/31/2021   TSH 1.660 12/31/2021      Assessment & Plan:   Problem List Items Addressed This Visit       Cardiovascular and Mediastinum   Essential hypertension - Primary   Relevant Medications   losartan (COZAAR) 100 MG tablet     Digestive   Angular cheilitis   Relevant  Medications   ketoconazole (NIZORAL) 2 % cream     Other   Depression, recurrent (HCC) Continue current meds  .  Meds ordered this encounter  Medications   losartan (COZAAR) 100 MG tablet    Sig: Take 1 tablet (100 mg total) by mouth daily.    Dispense:  30 tablet    Refill:  3    Order Specific Question:   Supervising Provider    Answer:   Corey Harold   ketoconazole (NIZORAL) 2 % cream    Sig: Apply 1 Application topically 2 (two) times daily.    Dispense:  15 g    Refill:  1    Order Specific Question:   Supervising Provider    Answer:   Corey Harold    No orders of the defined types were placed in this encounter.  Handout given on HPV  Follow-up: Return in about 3 weeks (around 02/11/2022) for nurse visit bp check and in 3 months for chronic fasting follow up.  An After Visit Summary was printed and given to the patient.  Jettie Pagan [facility] [phone]

## 2022-01-22 ENCOUNTER — Other Ambulatory Visit: Payer: Self-pay | Admitting: Physician Assistant

## 2022-01-22 DIAGNOSIS — I10 Essential (primary) hypertension: Secondary | ICD-10-CM

## 2022-01-22 DIAGNOSIS — F339 Major depressive disorder, recurrent, unspecified: Secondary | ICD-10-CM

## 2022-01-31 ENCOUNTER — Ambulatory Visit: Payer: No Typology Code available for payment source

## 2022-01-31 VITALS — BP 120/90 | HR 99

## 2022-01-31 DIAGNOSIS — I10 Essential (primary) hypertension: Secondary | ICD-10-CM

## 2022-01-31 NOTE — Progress Notes (Signed)
Patient is here for blood pressure check. Her blood pressure was 120/90. She started Losartan 100 mg daily 2 weeks ago. Marianne Sofia was informed and she recommended to continue with current treatment because blood pressure is improving and come back in 2 weeks to recheck blood pressure again. Patient mentioned that she will be out of town for one month. She will go to a pharmacy to check her blood pressure in 2 weeks and give [place] a call and come for a nurse visit when she comes back to town.

## 2022-02-05 ENCOUNTER — Other Ambulatory Visit: Payer: Self-pay

## 2022-02-05 NOTE — Telephone Encounter (Signed)
Patient called and also stated that her Effexor-xr 37.5 mg is not working and she would like to change it. Please advise. Patient needs rx sent

## 2022-02-06 ENCOUNTER — Telehealth: Payer: Self-pay

## 2022-02-06 ENCOUNTER — Other Ambulatory Visit: Payer: Self-pay

## 2022-02-06 MED ORDER — FLUOCINONIDE 0.05 % EX CREA: 1.0000 | TOPICAL_CREAM | Freq: Two times a day (BID) | CUTANEOUS | 2 refills | Status: DC

## 2022-02-06 NOTE — Telephone Encounter (Signed)
Patient Stated She has tried the following medication: Wellbutrin ( Caused anger) , lexapro 10 mg, Zoloft 25 mg, Paxil 20 mg, amitriptyline 25mg , cymbalta 30 m ( Made patient tried) , xanax 0.5 mg  Effexor XR 37.5 I causing her to be moody, angry, and a lot of crying. She will like to the following Pharmacy:  [id number] - [place], [place] - [address]

## 2022-02-06 NOTE — Telephone Encounter (Signed)
Since she has tried multiple medications in past with no improvement of symptoms would recommend either to do the [facility] testing or go ahead and be seen by psychiatrist for further intervention and treatment

## 2022-02-06 NOTE — Telephone Encounter (Signed)
Patient stated that the medication made her very moody, and crying, she would like to change it. Patient also stated she start bleeding and is menopausal has not an period in a while, now she is lightly bleeding. Offer her an appointment she stated she is in [place] for a month, will see ER if it get bad.

## 2022-02-07 ENCOUNTER — Other Ambulatory Visit: Payer: Self-pay | Admitting: Physician Assistant

## 2022-02-07 DIAGNOSIS — F339 Major depressive disorder, recurrent, unspecified: Secondary | ICD-10-CM

## 2022-02-07 MED ORDER — ESCITALOPRAM OXALATE 10 MG PO TABS: 10.0000 mg | ORAL_TABLET | Freq: Every day | ORAL | 0 refills | Status: DC

## 2022-02-07 NOTE — Telephone Encounter (Signed)
Patient stated she has tried lexapro, Zoloft, Paxil, and xanax at  low dose, they didn't cause any side affects.patient just was never given a higher dose, would like to try one of these medication again.

## 2022-02-10 ENCOUNTER — Other Ambulatory Visit: Payer: Self-pay

## 2022-02-10 DIAGNOSIS — F339 Major depressive disorder, recurrent, unspecified: Secondary | ICD-10-CM

## 2022-02-10 MED ORDER — ESCITALOPRAM OXALATE 10 MG PO TABS: 10.0000 mg | ORAL_TABLET | Freq: Every day | ORAL | 0 refills | Status: DC

## 2022-02-10 NOTE — Telephone Encounter (Signed)
Called patient left detailed message, to call office with concerns and to make 1 month follow up.

## 2022-03-01 ENCOUNTER — Other Ambulatory Visit: Payer: Self-pay | Admitting: Physician Assistant

## 2022-03-01 DIAGNOSIS — F339 Major depressive disorder, recurrent, unspecified: Secondary | ICD-10-CM

## 2022-03-17 ENCOUNTER — Other Ambulatory Visit: Payer: Self-pay | Admitting: Physician Assistant

## 2022-03-17 DIAGNOSIS — F339 Major depressive disorder, recurrent, unspecified: Secondary | ICD-10-CM

## 2022-03-19 ENCOUNTER — Ambulatory Visit: Payer: No Typology Code available for payment source | Admitting: Physician Assistant

## 2022-03-25 ENCOUNTER — Ambulatory Visit (INDEPENDENT_AMBULATORY_CARE_PROVIDER_SITE_OTHER): Payer: No Typology Code available for payment source | Admitting: Physician Assistant

## 2022-03-25 ENCOUNTER — Encounter: Payer: Self-pay | Admitting: Physician Assistant

## 2022-03-25 ENCOUNTER — Other Ambulatory Visit: Payer: Self-pay | Admitting: Physician Assistant

## 2022-03-25 VITALS — BP 138/90 | HR 85 | Temp 97.7°F | Ht 62.0 in | Wt 247.0 lb

## 2022-03-25 DIAGNOSIS — E782 Mixed hyperlipidemia: Secondary | ICD-10-CM | POA: Diagnosis not present

## 2022-03-25 DIAGNOSIS — F339 Major depressive disorder, recurrent, unspecified: Secondary | ICD-10-CM | POA: Diagnosis not present

## 2022-03-25 DIAGNOSIS — R5381 Other malaise: Secondary | ICD-10-CM | POA: Insufficient documentation

## 2022-03-25 NOTE — Progress Notes (Signed)
Subjective:  Patient ID: Natalie Hoffman, female    DOB: Apr 01, 1976  Age: 46 y.o. MRN: [id number]  Chief Complaint  Patient presents with   Depression    1 month f/u    HPI  Pt in today for follow up of depression - she states she has ran out of lexapro several days ago - states medication was not working anyway.  She has tried multiple medications over the past several years and has not had relief with any of the meds that have been tried The list includes venlafexine 37.5mg , wellbutrin (caused anger), lexapro 10mg , zoloft 25mg , paxil 20 , amitriptyline 25, cymbalta 30 (tired) and xanax 0.5mg  A lot of the medications have only been at starting dose  Pt complains of crying spells, mood swings and overall depressive symptoms for years -  She used to see a therapist and states she will need to call and make another appointment for follow up  Pt was diagnosed with hyperlipidemia and recently started crestor 5mg  qd - is due for repeat labwork Current Outpatient Medications on File Prior to Visit  Medication Sig Dispense Refill   cetirizine (ZYRTEC) 10 MG tablet Take 10 mg by mouth daily.     escitalopram (LEXAPRO) 10 MG tablet TAKE 1 TABLET BY MOUTH EVERY DAY 90 tablet 0   esomeprazole (NEXIUM) 40 MG capsule Take 40 mg by mouth daily at 12 noon.     ferrous sulfate 325 (65 FE) MG EC tablet Take 325 mg by mouth 3 (three) times daily with meals.     fluocinonide cream (LIDEX) 0.05 % Apply 1 Application topically 2 (two) times daily. 30 g 2   gabapentin (NEURONTIN) 300 MG capsule Take by mouth.     ketoconazole (NIZORAL) 2 % cream Apply 1 Application topically 2 (two) times daily. 15 g 1   losartan (COZAAR) 100 MG tablet Take 1 tablet (100 mg total) by mouth daily. 30 tablet 3   Multiple Vitamins-Minerals (BARIATRIC MULTIVITAMINS/IRON) CAPS Take by mouth.     pramipexole (MIRAPEX) 0.5 MG tablet Take 0.5 mg by mouth 3 (three) times daily.     rosuvastatin (CRESTOR) 5 MG tablet Take 1  tablet (5 mg total) by mouth daily. 90 tablet 0   Vitamin D, Ergocalciferol, (DRISDOL) 1.25 MG (50000 UNIT) CAPS capsule Take 50,000 Units by mouth once a week.     No current facility-administered medications on file prior to visit.   Past Medical History:  Diagnosis Date   Arthritis    Depression    GERD (gastroesophageal reflux disease)    Hypertension    Past Surgical History:  Procedure Laterality Date   BACK SURGERY  2012   CARDIAC CATHETERIZATION     negative study no CAD   CESAREAN SECTION     FLEXOR TENOTOMY  Left 12/19/2021   Procedure: LEFT BRACHIORADIALIS RELEASE;  Surgeon: , MD;  Location: [facility];  Service: Orthopedics;  Laterality: Left;  Block   HERNIA REPAIR     LAPAROSCOPIC GASTRIC SLEEVE RESECTION     OPEN REDUCTION INTERNAL FIXATION (ORIF) DISTAL RADIAL FRACTURE Left 12/19/2021   Procedure: OPEN REDUCTION INTERNAL FIXATION (ORIF) LEFT DISTAL RADIUS FRACTURE;  Surgeon: 12/21/2021, MD;  Location: [facility];  Service: Orthopedics;  Laterality: Left;  Block    Family History  Problem Relation Age of Onset   Arthritis Mother    Hypertension Mother    Cancer Mother        VULVAR CANCER  Diabetes Mother    Hyperlipidemia Mother    Hypothyroidism Mother    Arthritis Father    Hypertension Father    Hyperlipidemia Father    Breast cancer Maternal Aunt    Heart attack Paternal Grandfather    Social History   Socioeconomic History   Marital status: Single    Spouse name: Not on file   Number of children: 1   Years of education: Not on file   Highest education level: Not on file  Occupational History   Not on file  Tobacco Use   Smoking status: Never   Smokeless tobacco: Never  Vaping Use   Vaping Use: Never used  Substance and Sexual Activity   Alcohol use: Yes    Comment: OCCASIONAL   Drug use: Never   Sexual activity: Not on file  Other Topics Concern   Not on file  Social History Narrative   Not  on file   Social Determinants of Health   Financial Resource Strain: Not on file  Food Insecurity: Not on file  Transportation Needs: Not on file  Physical Activity: Not on file  Stress: Not on file  Social Connections: Not on file    Review of Systems CONSTITUTIONAL: has had moderate fatigue E/N/T: Negative for ear pain, nasal congestion and sore throat.  CARDIOVASCULAR: Negative for chest pain, dizziness, palpitations  RESPIRATORY: Negative for recent cough and dyspnea.   PSYCHIATRIC:see HPI      Objective:  PHYSICAL EXAM:   VS: BP (!) 138/90 (BP Location: Left Arm, Patient Position: Sitting)   Pulse 85   Temp 97.7 F (36.5 C) (Temporal)   Ht 5\' 2"  (1.575 m)   Wt 247 lb (112 kg)   SpO2 98%   BMI 45.18 kg/m   GEN: Well nourished, well developed, in no acute distress   Cardiac: RRR; no murmurs, rubs, or gallops,no edema -  Respiratory:  normal respiratory rate and pattern with no distress - normal breath sounds with no rales, rhonchi, wheezes or rubs  Psych: pt with appropriate affect but tearful     03/25/2022    3:06 PM 01/21/2022    2:56 PM 12/31/2021    2:47 PM  Depression screen PHQ 2/9  Decreased Interest 3 3 1   Down, Depressed, Hopeless 3 1 2   PHQ - 2 Score 6 4 3   Altered sleeping 2 2 2   Tired, decreased energy 3 2 3   Change in appetite 3 1 2   Feeling bad or failure about yourself  2 1 3   Trouble concentrating 2 0 1  Moving slowly or fidgety/restless 2 0 0  Suicidal thoughts 1 0 1  PHQ-9 Score 21 10 15   Difficult doing work/chores Somewhat difficult Somewhat difficult Somewhat difficult    Lab Results  Component Value Date   CHOL 250 (H) 12/31/2021   TRIG 242 (H) 12/31/2021   HDL 57 12/31/2021   LDLCALC 149 (H) 12/31/2021   TSH 1.660 12/31/2021      Assessment & Plan:   Problem List Items Addressed This Visit       Other   Depression, recurrent ([place]) Will order genesight testing for testing to guide for treatment Pt to make appt  with therapist   Malaise - Primary   Relevant Orders   CBC with Differential/Platelet   Comprehensive metabolic panel   TSH   Lipid panel   Mixed hyperlipidemia   Relevant Orders   Comprehensive metabolic panel   Lipid panel Continue meds and watch diet  .  No orders of the defined types were placed in this encounter.   Orders Placed This Encounter  Procedures   CBC with Differential/Platelet   Comprehensive metabolic panel   TSH   Lipid panel     Follow-up: Return in about 4 weeks (around 04/22/2022) for follow up.  An After Visit Summary was printed and given to the patient.  Jettie Pagan [facility] [phone]

## 2022-03-26 LAB — CBC WITH DIFFERENTIAL/PLATELET
Basophils Absolute: 0.1 10*3/uL (ref 0.0–0.2)
Basos: 1 %
EOS (ABSOLUTE): 0.2 10*3/uL (ref 0.0–0.4)
Eos: 2 %
Hematocrit: 46.1 % (ref 34.0–46.6)
Hemoglobin: 15.2 g/dL (ref 11.1–15.9)
Immature Grans (Abs): 0 10*3/uL (ref 0.0–0.1)
Immature Granulocytes: 0 %
Lymphocytes Absolute: 2.8 10*3/uL (ref 0.7–3.1)
Lymphs: 33 %
MCH: 31.6 pg (ref 26.6–33.0)
MCHC: 33 g/dL (ref 31.5–35.7)
MCV: 96 fL (ref 79–97)
Monocytes Absolute: 0.5 10*3/uL (ref 0.1–0.9)
Monocytes: 6 %
Neutrophils Absolute: 5 10*3/uL (ref 1.4–7.0)
Neutrophils: 58 %
Platelets: 324 10*3/uL (ref 150–450)
RBC: 4.81 x10E6/uL (ref 3.77–5.28)
RDW: 12.5 % (ref 11.7–15.4)
WBC: 8.6 10*3/uL (ref 3.4–10.8)

## 2022-03-26 LAB — COMPREHENSIVE METABOLIC PANEL
ALT: 26 IU/L (ref 0–32)
AST: 18 IU/L (ref 0–40)
Albumin/Globulin Ratio: 2 (ref 1.2–2.2)
Albumin: 4.3 g/dL (ref 3.9–4.9)
Alkaline Phosphatase: 84 IU/L (ref 44–121)
BUN/Creatinine Ratio: 15 (ref 9–23)
BUN: 11 mg/dL (ref 6–24)
Bilirubin Total: 0.5 mg/dL (ref 0.0–1.2)
CO2: 25 mmol/L (ref 20–29)
Calcium: 9.7 mg/dL (ref 8.7–10.2)
Chloride: 103 mmol/L (ref 96–106)
Creatinine, Ser: 0.72 mg/dL (ref 0.57–1.00)
Globulin, Total: 2.2 g/dL (ref 1.5–4.5)
Glucose: 89 mg/dL (ref 70–99)
Potassium: 4.4 mmol/L (ref 3.5–5.2)
Sodium: 142 mmol/L (ref 134–144)
Total Protein: 6.5 g/dL (ref 6.0–8.5)
eGFR: 105 mL/min/{1.73_m2} (ref >59)

## 2022-03-26 LAB — LIPID PANEL
Chol/HDL Ratio: 3.4 ratio (ref 0.0–4.4)
Cholesterol, Total: 209 mg/dL — ABNORMAL HIGH (ref 100–199)
HDL: 62 mg/dL (ref >39)
LDL Chol Calc (NIH): 117 mg/dL — ABNORMAL HIGH (ref 0–99)
Triglycerides: 170 mg/dL — ABNORMAL HIGH (ref 0–149)
VLDL Cholesterol Cal: 30 mg/dL (ref 5–40)

## 2022-03-26 LAB — TSH: TSH: 1.5 u[IU]/mL (ref 0.450–4.500)

## 2022-03-26 LAB — CARDIOVASCULAR RISK ASSESSMENT

## 2022-03-27 ENCOUNTER — Encounter: Payer: Self-pay | Admitting: Podiatry

## 2022-03-27 ENCOUNTER — Ambulatory Visit (INDEPENDENT_AMBULATORY_CARE_PROVIDER_SITE_OTHER): Payer: No Typology Code available for payment source

## 2022-03-27 ENCOUNTER — Ambulatory Visit (INDEPENDENT_AMBULATORY_CARE_PROVIDER_SITE_OTHER): Payer: No Typology Code available for payment source | Admitting: Podiatry

## 2022-03-27 DIAGNOSIS — M25571 Pain in right ankle and joints of right foot: Secondary | ICD-10-CM

## 2022-03-27 DIAGNOSIS — S99911A Unspecified injury of right ankle, initial encounter: Secondary | ICD-10-CM

## 2022-03-27 DIAGNOSIS — S99921A Unspecified injury of right foot, initial encounter: Secondary | ICD-10-CM

## 2022-03-27 MED ORDER — METHYLPREDNISOLONE 4 MG PO TBPK: ORAL_TABLET | ORAL | 0 refills | Status: DC

## 2022-03-27 NOTE — Patient Instructions (Addendum)
Place 1/4 cup of epsom salts in a quart of warm tap water.  Submerge your foot or feet in the solution and soak for 20 minutes.  This soak should be done twice a day.  Next, remove your foot or feet from solution, blot dry the affected area. Apply ointment and cover if instructed by your doctor.   IF YOUR SKIN BECOMES IRRITATED WHILE USING THESE INSTRUCTIONS, IT IS OKAY TO SWITCH TO  [place] VINEGAR AND WATER.  As another alternative soak, you may use antibacterial soap and water.  Monitor for any signs/symptoms of infection. Call the office immediately if any occur or go directly to the emergency room. Call with any questions/concerns.  

## 2022-03-27 NOTE — Progress Notes (Signed)
Subjective:  Patient ID: Natalie Hoffman, female    DOB: 1975/09/05,   MRN: [id number]  Chief Complaint  Patient presents with   Ingrown Toenail     Left great toe nails , right foot fracture     46 y.o. female presents for concern of right great toenail injury that occurred earlier this month. Relates the nail came loose and was seen in urgent care and it was taped down. Patient relates it is still loose and sore and would like removed. Also relates a history of right ankle fracture that occurred back in May was seen in ED and ortho and treated for this however it has been 15 weeks and still having pain on the outside of her ankle. Was in a boot for a period of time. Has tried meloxicam and using a compression brace.  . Denies any other pedal complaints. Denies n/v/f/c.   Past Medical History:  Diagnosis Date   Arthritis    Depression    GERD (gastroesophageal reflux disease)    Hypertension     Objective:  Physical Exam: Vascular: DP/PT pulses 2/4 bilateral. CFT <3 seconds. Normal hair growth on digits. No edema.  Skin. No lacerations or abrasions bilateral feet. Right hallux nail loosely adhered to proximal nail bed. No erythema edema or purulence noted.  Musculoskeletal: MMT 5/5 bilateral lower extremities in DF, PF, Inversion and Eversion. Deceased ROM in DF of ankle joint. Tender to peroneal tendon posterior to lateral malleolus. Some pain to distal lateral malleolus and pain along ATFL and CFL. Pain with anterior drawer test. Pain with eversion and inversion of the ankle although minimal.  Neurological: Sensation intact to light touch.   Assessment:   1. Injury of right great toe, initial encounter   2. Right ankle injury, initial encounter      Plan:  Patient was evaluated and treated and all questions answered. Patient requesting removal of nail today. Procedure below.  Discussed procedure and post procedure care and patient expressed understanding.  Will  follow-up in 2 weeks for nail check or sooner if any problems arise.    Procedure:  Procedure: total Nail Avulsion of right hallux  Surgeon: Louann Sjogren, DPM  Pre-op Dx: Injury right hallux nail  Post-op: Same  Place of Surgery: Office exam room.  Indications for surgery: Painfu injury hallux nail    The patient is requesting removal of nail without chemical matrixectomy. Risks and complications were discussed with the patient for which they understand and written consent was obtained. Under sterile conditions a total of 3 mL of  1% lidocaine plain was infiltrated in a hallux block fashion. Once anesthetized, the skin was prepped in sterile fashion. A tourniquet was then applied. Next the entire right hallux nail removed and area copiously irrigated. Silvadene was applied. A dry sterile dressing was applied. After application of the dressing the tourniquet was removed and there is found to be an immediate capillary refill time to the digit. The patient tolerated the procedure well without any complications. Post procedure instructions were discussed the patient for which he verbally understood. Follow-up in two weeks for nail check or sooner if any problems are to arise. Discussed signs/symptoms of infection and directed to call the office immediately should any occur or go directly to the emergency room. In the meantime, encouraged to call the office with any questions, concerns, changes symptoms.   X-rays reviewed and discussed with patient. No acute Fractures or dislocations noted. Previous fracture noted that appears to be  healing but still not complete healing noted.  Discussed ankle fracture,  peroneal tendinitis ankle sprain and treatment options at length with patient Discussed stretching exercises and provided handout. Prescription for medrol dose back provided.  Dispensed Tri-Lock ankle brace. Discussed that if the symptoms do not improve can consider PT/MRI. Patient to return in 2  weeks for nail check.    Louann Sjogren, DPM

## 2022-03-31 ENCOUNTER — Other Ambulatory Visit: Payer: Self-pay | Admitting: Physician Assistant

## 2022-03-31 MED ORDER — DESVENLAFAXINE SUCCINATE ER 50 MG PO TB24: 50.0000 mg | ORAL_TABLET | Freq: Every day | ORAL | 1 refills | Status: DC

## 2022-04-04 ENCOUNTER — Encounter: Payer: Self-pay | Admitting: Podiatry

## 2022-04-17 ENCOUNTER — Ambulatory Visit: Payer: No Typology Code available for payment source | Admitting: Podiatry

## 2022-04-19 ENCOUNTER — Other Ambulatory Visit: Payer: Self-pay | Admitting: Physician Assistant

## 2022-04-19 DIAGNOSIS — I10 Essential (primary) hypertension: Secondary | ICD-10-CM

## 2022-04-23 ENCOUNTER — Other Ambulatory Visit: Payer: Self-pay | Admitting: Physician Assistant

## 2022-04-24 ENCOUNTER — Ambulatory Visit: Payer: No Typology Code available for payment source | Admitting: Physician Assistant

## 2022-05-01 ENCOUNTER — Ambulatory Visit: Payer: No Typology Code available for payment source | Admitting: Podiatry

## 2022-05-02 ENCOUNTER — Ambulatory Visit (INDEPENDENT_AMBULATORY_CARE_PROVIDER_SITE_OTHER): Payer: No Typology Code available for payment source | Admitting: Physician Assistant

## 2022-05-02 ENCOUNTER — Encounter: Payer: Self-pay | Admitting: Physician Assistant

## 2022-05-02 VITALS — BP 128/88 | HR 78 | Temp 97.3°F | Ht 62.0 in | Wt 255.6 lb

## 2022-05-02 DIAGNOSIS — Z23 Encounter for immunization: Secondary | ICD-10-CM

## 2022-05-02 DIAGNOSIS — L304 Erythema intertrigo: Secondary | ICD-10-CM | POA: Insufficient documentation

## 2022-05-02 DIAGNOSIS — F339 Major depressive disorder, recurrent, unspecified: Secondary | ICD-10-CM

## 2022-05-02 MED ORDER — CLOTRIMAZOLE-BETAMETHASONE 1-0.05 % EX CREA: 1.0000 | TOPICAL_CREAM | Freq: Every day | CUTANEOUS | 2 refills | Status: DC

## 2022-05-02 NOTE — Progress Notes (Signed)
Subjective:  Patient ID: Natalie Hoffman, female    DOB: 1976/03/06  Age: 46 y.o. MRN: [id number]  Chief Complaint  Patient presents with   Hypertension    HPI Pt states she has a rash under right breast - red, inflamed and burning - has tried otc meds but not cleared  Pt with history of anxiety and depression.  She did the genesight testing and results showed that Pristiq would be compatible for patient She has tried multiple medications in the past without improvement of symptoms and states she does feel that pristiq 50mg  is helping - she feels she is more patient with her daughter and not having crying spells  She is going to counseling in person every other week  She requests flu shot today Current Outpatient Medications on File Prior to Visit  Medication Sig Dispense Refill   cetirizine (ZYRTEC) 10 MG tablet Take 10 mg by mouth daily.     desvenlafaxine (PRISTIQ) 50 MG 24 hr tablet TAKE 1 TABLET BY MOUTH EVERY DAY 90 tablet 0   esomeprazole (NEXIUM) 40 MG capsule Take 40 mg by mouth daily at 12 noon.     ferrous sulfate 325 (65 FE) MG EC tablet Take 325 mg by mouth 3 (three) times daily with meals.     fluocinonide cream (LIDEX) 5.09 % Apply 1 Application topically 2 (two) times daily. 30 g 2   gabapentin (NEURONTIN) 300 MG capsule Take by mouth.     ketoconazole (NIZORAL) 2 % cream Apply 1 Application topically 2 (two) times daily. 15 g 1   losartan (COZAAR) 100 MG tablet TAKE 1 TABLET BY MOUTH EVERY DAY 90 tablet 0   methylPREDNISolone (MEDROL DOSEPAK) 4 MG TBPK tablet Take as directed 21 tablet 0   Multiple Vitamins-Minerals (BARIATRIC MULTIVITAMINS/IRON) CAPS Take by mouth.     pramipexole (MIRAPEX) 0.5 MG tablet Take 0.5 mg by mouth 3 (three) times daily.     rosuvastatin (CRESTOR) 5 MG tablet TAKE 1 TABLET (5 MG TOTAL) BY MOUTH DAILY. 90 tablet 0   Vitamin D, Ergocalciferol, (DRISDOL) 1.25 MG (50000 UNIT) CAPS capsule Take 50,000 Units by mouth once a week.     No  current facility-administered medications on file prior to visit.   Past Medical History:  Diagnosis Date   Arthritis    Depression    GERD (gastroesophageal reflux disease)    Hypertension    Past Surgical History:  Procedure Laterality Date   BACK SURGERY  2012   CARDIAC CATHETERIZATION     negative study no CAD   CESAREAN SECTION     FLEXOR TENOTOMY  Left 12/19/2021   Procedure: LEFT BRACHIORADIALIS RELEASE;  Surgeon: Leanora Cover, MD;  Location: [place];  Service: Orthopedics;  Laterality: Left;  Block   HERNIA REPAIR     LAPAROSCOPIC GASTRIC SLEEVE RESECTION     OPEN REDUCTION INTERNAL FIXATION (ORIF) DISTAL RADIAL FRACTURE Left 12/19/2021   Procedure: OPEN REDUCTION INTERNAL FIXATION (ORIF) LEFT DISTAL RADIUS FRACTURE;  Surgeon: Leanora Cover, MD;  Location: [place];  Service: Orthopedics;  Laterality: Left;  Block    Family History  Problem Relation Age of Onset   Arthritis Mother    Hypertension Mother    Cancer Mother        VULVAR CANCER   Diabetes Mother    Hyperlipidemia Mother    Hypothyroidism Mother    Arthritis Father    Hypertension Father    Hyperlipidemia Father  Breast cancer Maternal Aunt    Heart attack Paternal Grandfather    Social History   Socioeconomic History   Marital status: Single    Spouse name: Not on file   Number of children: 1   Years of education: Not on file   Highest education level: Not on file  Occupational History   Not on file  Tobacco Use   Smoking status: Never   Smokeless tobacco: Never  Vaping Use   Vaping Use: Never used  Substance and Sexual Activity   Alcohol use: Yes    Comment: OCCASIONAL   Drug use: Never   Sexual activity: Not on file  Other Topics Concern   Not on file  Social History Narrative   Not on file   Social Determinants of Health   Financial Resource Strain: Not on file  Food Insecurity: Not on file  Transportation Needs: Not on file  Physical  Activity: Not on file  Stress: Not on file  Social Connections: Not on file    Review of Systems CONSTITUTIONAL: Negative for chills, fatigue, fever,  CARDIOVASCULAR: Negative for chest pain, dizziness, palpitations and pedal edema.  RESPIRATORY: Negative for recent cough and dyspnea.   INTEGUMENTARY: see HPI  PSYCHIATRIC: see HPI      Objective:  PHYSICAL EXAM:   VS: BP 128/88 (BP Location: Left Arm, Patient Position: Sitting, Cuff Size: Large)   Pulse 78   Temp (!) 97.3 F (36.3 C) (Temporal)   Ht 5\' 2"  (1.575 m)   Wt 255 lb 9.6 oz (115.9 kg)   SpO2 98%   BMI 46.75 kg/m   GEN: Well nourished, well developed, in no acute distress   Cardiac: RRR; no murmurs,  Respiratory:  normal respiratory rate and pattern with no distress - normal breath sounds with no rales, rhonchi, wheezes or rubs  Skin: red inflamed rash under right breast  Psych: euthymic mood, appropriate affect and demeanor  Lab Results  Component Value Date   WBC 8.6 03/25/2022   HGB 15.2 03/25/2022   HCT 46.1 03/25/2022   PLT 324 03/25/2022   GLUCOSE 89 03/25/2022   CHOL 209 (H) 03/25/2022   TRIG 170 (H) 03/25/2022   HDL 62 03/25/2022   LDLCALC 117 (H) 03/25/2022   ALT 26 03/25/2022   AST 18 03/25/2022   NA 142 03/25/2022   K 4.4 03/25/2022   CL 103 03/25/2022   CREATININE 0.72 03/25/2022   BUN 11 03/25/2022   CO2 25 03/25/2022   TSH 1.500 03/25/2022      Assessment & Plan:   Problem List Items Addressed This Visit       Musculoskeletal and Integument   Intertrigo - Primary   Relevant Medications   clotrimazole-betamethasone (LOTRISONE) cream     Other   Depression, recurrent ([facility])   Needs flu shot   Relevant Orders   Flu Vaccine QUAD 6+ mos PF IM (Fluarix Quad PF) (Completed)  .  Meds ordered this encounter  Medications   clotrimazole-betamethasone (LOTRISONE) cream    Sig: Apply 1 Application topically daily.    Dispense:  30 g    Refill:  2    Order Specific  Question:   Supervising Provider    Answer08/24/2023    Orders Placed This Encounter  Procedures   Flu Vaccine QUAD 6+ mos PF IM (Fluarix Quad PF)     Follow-up: Return in about 8 weeks (around 06/27/2022) for chronic fasting follow up.  An After Visit  Summary was printed and given to the patient.  Yetta Flock [facility] [phone]

## 2022-05-20 ENCOUNTER — Ambulatory Visit (INDEPENDENT_AMBULATORY_CARE_PROVIDER_SITE_OTHER): Payer: No Typology Code available for payment source | Admitting: Podiatry

## 2022-05-20 DIAGNOSIS — G8929 Other chronic pain: Secondary | ICD-10-CM

## 2022-05-20 DIAGNOSIS — S99921A Unspecified injury of right foot, initial encounter: Secondary | ICD-10-CM | POA: Diagnosis not present

## 2022-05-20 DIAGNOSIS — M25571 Pain in right ankle and joints of right foot: Secondary | ICD-10-CM | POA: Diagnosis not present

## 2022-05-20 DIAGNOSIS — M7671 Peroneal tendinitis, right leg: Secondary | ICD-10-CM

## 2022-05-20 NOTE — Progress Notes (Unsigned)
Subjective:  Patient ID: Natalie Hoffman, female    DOB: Jul 21, 1976,  MRN: [id number]  Chief Complaint  Patient presents with   Follow-up    Nail check- no pus or bleeding.     Ankle Pain    Patient complaining of achiness to right ankle. Using Tri-Lock brace when she walks for long period of time.     46 y.o. female presents for concern of nail check to the right hallux nail.  She had a traumatic partial nail avulsion in August.  The nail was further removed by Dr. Ralene Cork due to subungual hematoma and pain.  The area has been healing up well and she denies pain.  She has been doing soaks and antibiotic ointment to the area.  She is also coming in for recheck of right ankle pain on the outside of the pain.  She has tried using a Tri-Lock ankle brace but it has not seemed to fully alleviate her pain.  Has tried steroid Dosepak in the past.  Past Medical History:  Diagnosis Date   Arthritis    Depression    GERD (gastroesophageal reflux disease)    Hypertension     Allergies  Allergen Reactions   Penicillins Rash    ROS: Negative except as per HPI above  Objective:  General: AAO x3, NAD  Dermatological: With inspection of the right hallux nail it is noted to be removed with nail bed very healthy no evidence of eschar or callus formation.  No erythema edema or drainage.  Proximal nail is beginning to grow.  Vascular:  Dorsalis Pedis artery and Posterior Tibial artery pedal pulses are 2/4 bilateral.  Capillary fill time < 3 sec to all digits.   Neruologic: Grossly intact via light touch bilateral. Protective threshold intact to all sites bilateral.   Musculoskeletal: Pain on palpation of the right lateral ankle along the course of the peroneal tendons which do show some edema clinically.  Also some pain along the course of the ATFL and CFL ligaments.  Pain increased with resisted ankle range of motion especially eversion range of motion.  Gait: Unassisted, Nonantalgic.    No images are attached to the encounter.  Assessment:   1. Peroneal tendinitis of lower leg, right   2. Injury of right great toe, initial encounter   3. Chronic pain of right ankle      Plan:  Patient was evaluated and treated and all questions answered.  #Status post total right hallux nail avulsion -Nail is healing well at this time no evidence of concern for infection -Discussed the patient does not need to continue doing soaks or put antibiotic limit of this area.  It is fully healed at this time. -Continue to monitor as the nail regrows.  #Chronic right lateral ankle pain, not improving after conservative therapy including anti-inflammatory medication and steroid Tri-Lock ankle brace, concern for peroneal tendinitis versus lateral ankle ligament damage -Discussed with patient further options at this time. -I recommend we proceed with an MRI to evaluate the peroneal tendons and lateral ankle ligaments to evaluate for possible partial or full tears and any other pathology that may be causing her pain not evidence on x-ray. -I also discussed the possibility of physical therapy.  She wants to hold off on this at this time as she does not feel she has time to do physical therapy. -Patient will follow-up in 4 weeks after getting the MRI to discuss results and further treatment recommendations.  Return in about 4 weeks (  around 06/17/2022) for follow up r ankle MRI.          Everitt Amber, DPM [facility]

## 2022-06-09 ENCOUNTER — Encounter: Payer: Self-pay | Admitting: Podiatry

## 2022-06-09 ENCOUNTER — Ambulatory Visit
Admission: RE | Admit: 2022-06-09 | Discharge: 2022-06-09 | Disposition: A | Discharge status: Home / Self Care | Payer: No Typology Code available for payment source | Source: Ambulatory Visit | Attending: Podiatry | Admitting: Podiatry

## 2022-06-09 DIAGNOSIS — M7671 Peroneal tendinitis, right leg: Secondary | ICD-10-CM

## 2022-06-09 DIAGNOSIS — G8929 Other chronic pain: Secondary | ICD-10-CM

## 2022-06-11 ENCOUNTER — Other Ambulatory Visit: Payer: Self-pay | Admitting: Physician Assistant

## 2022-06-11 ENCOUNTER — Telehealth: Payer: Self-pay

## 2022-06-11 DIAGNOSIS — F339 Major depressive disorder, recurrent, unspecified: Secondary | ICD-10-CM

## 2022-06-11 MED ORDER — DESVENLAFAXINE SUCCINATE ER 100 MG PO TB24: 100.0000 mg | ORAL_TABLET | Freq: Every day | ORAL | 0 refills | Status: DC

## 2022-06-11 NOTE — Telephone Encounter (Signed)
Patient called stating she feels her pristiq needs to be increased. States she "is sleeping a lot, still feels very depressed".

## 2022-06-11 NOTE — Telephone Encounter (Signed)
Left message for patient on VM, stated for patient to call back with questions.

## 2022-06-11 NOTE — Telephone Encounter (Signed)
Will increase to 100mg  qd - follow up as scheduled later this month Rx sent to pharmacy

## 2022-06-15 ENCOUNTER — Other Ambulatory Visit: Payer: Self-pay | Admitting: Physician Assistant

## 2022-06-23 ENCOUNTER — Ambulatory Visit (INDEPENDENT_AMBULATORY_CARE_PROVIDER_SITE_OTHER): Payer: No Typology Code available for payment source | Admitting: Podiatry

## 2022-06-23 DIAGNOSIS — S82839A Other fracture of upper and lower end of unspecified fibula, initial encounter for closed fracture: Secondary | ICD-10-CM

## 2022-06-23 DIAGNOSIS — G8929 Other chronic pain: Secondary | ICD-10-CM

## 2022-06-23 DIAGNOSIS — M25571 Pain in right ankle and joints of right foot: Secondary | ICD-10-CM | POA: Diagnosis not present

## 2022-06-23 DIAGNOSIS — M7671 Peroneal tendinitis, right leg: Secondary | ICD-10-CM | POA: Diagnosis not present

## 2022-06-23 DIAGNOSIS — S99911A Unspecified injury of right ankle, initial encounter: Secondary | ICD-10-CM | POA: Diagnosis not present

## 2022-06-23 NOTE — Progress Notes (Unsigned)
Subjective:  Patient ID: Natalie Hoffman, female    DOB: Aug 27, 1975,  MRN: [id number]  Chief Complaint  Patient presents with   Follow-up    Follow up right ankle MRI.     46 y.o. female presents for for follow-up concern of right ankle chronic pain.  She is also here to discuss her right ankle MRI results.  She reports she is still having pain in the right ankle on the outside of the ankle unchanged from prior.  She has been using the ankle brace but does not feel that it is helping at all.  Past Medical History:  Diagnosis Date   Arthritis    Depression    GERD (gastroesophageal reflux disease)    Hypertension     Allergies  Allergen Reactions   Penicillins Rash    ROS: Negative except as per HPI above  Objective:  General: AAO x3, NAD  Dermatological: Right hallux nail that is healthy without evidence of ingrown.  Vascular:  Dorsalis Pedis artery and Posterior Tibial artery pedal pulses are 2/4 bilateral.  Capillary fill time < 3 sec to all digits.   Neruologic: Grossly intact via light touch bilateral. Protective threshold intact to all sites bilateral.   Musculoskeletal: Pain on palpation of the distal aspect of the fibula near the tip of the fibula.  This is at site of prior fracture.  Pain is increased with direct pressure on this area.  Less pain with palpation about the ATFL and CFL.  Gait: Unassisted, Nonantalgic.   No images are attached to the encounter.  MRI right ankle without contrast 06/09/2022 1. No evidence of peroneal tendon tear. Peroneal longus and brevis are intact. 2. Ligaments of the medial and lateral ankle are intact. 3. No evidence of fracture or bone contusion. No evidence of osteochondral injury. 4. Small osseous fragment about the inferior aspect of lateral malleolus suggesting sequela of prior trauma. 5. Small ankle joint effusion.  Assessment:   1. Peroneal tendinitis of lower leg, right   2. Chronic pain of right ankle    3. Right ankle injury, initial encounter      Plan:  Patient was evaluated and treated and all questions answered.  #Chronic right lateral ankle pain, not improving after conservative therapy including anti-inflammatory medication and steroid Tri-Lock ankle brace -I discussed with the patient that at this point based on the MRI findings which did not show significant peroneal tendon or ankle ligament abnormality I believe that her primary issue is related to nonhealing of the distal fibula avulsion fracture. -I would like to try a course of physical therapy to see if this has any improvement for her before we proceed with surgical intervention. -I recommend continued use of the Tri-Lock brace. -I also recommend we try with a steroid injection into the right ankle.  Patient was agreeable to this.  After sterile prep with an alcohol swab I proceeded with an injection of 1 cc half percent Marcaine plain with 1 cc Kenalog 10 into the lateral aspect of the right tibiotalar joint.  Patient tolerated well adhesive bandage was applied. -Referral placed for 6 weeks course of physical therapy. -If patient is still having pain after 6 weeks we will have to begin surgical planning with likely plan being excision of fracture fragment distal fibula and repair of the CFL.  Return in about 6 weeks (around 08/04/2022) for follw up R ankle.          Corinna Gab, DPM [facility] / [facility]

## 2022-07-05 ENCOUNTER — Other Ambulatory Visit: Payer: Self-pay | Admitting: Physician Assistant

## 2022-07-05 DIAGNOSIS — I10 Essential (primary) hypertension: Secondary | ICD-10-CM

## 2022-07-08 ENCOUNTER — Encounter: Payer: Self-pay | Admitting: Physician Assistant

## 2022-07-08 ENCOUNTER — Ambulatory Visit (INDEPENDENT_AMBULATORY_CARE_PROVIDER_SITE_OTHER): Payer: No Typology Code available for payment source | Admitting: Physician Assistant

## 2022-07-08 VITALS — BP 128/82 | HR 96 | Temp 97.1°F | Ht 62.0 in | Wt 263.2 lb

## 2022-07-08 DIAGNOSIS — Z1211 Encounter for screening for malignant neoplasm of colon: Secondary | ICD-10-CM

## 2022-07-08 DIAGNOSIS — F339 Major depressive disorder, recurrent, unspecified: Secondary | ICD-10-CM

## 2022-07-08 DIAGNOSIS — E782 Mixed hyperlipidemia: Secondary | ICD-10-CM | POA: Diagnosis not present

## 2022-07-08 DIAGNOSIS — E559 Vitamin D deficiency, unspecified: Secondary | ICD-10-CM

## 2022-07-08 DIAGNOSIS — I10 Essential (primary) hypertension: Secondary | ICD-10-CM

## 2022-07-08 DIAGNOSIS — L304 Erythema intertrigo: Secondary | ICD-10-CM

## 2022-07-08 DIAGNOSIS — K219 Gastro-esophageal reflux disease without esophagitis: Secondary | ICD-10-CM

## 2022-07-08 MED ORDER — PRAMIPEXOLE DIHYDROCHLORIDE 0.5 MG PO TABS: 0.5000 mg | ORAL_TABLET | Freq: Three times a day (TID) | ORAL | 1 refills | Status: DC

## 2022-07-08 MED ORDER — VITAMIN D (ERGOCALCIFEROL) 1.25 MG (50000 UNIT) PO CAPS: 50000.0000 [IU] | ORAL_CAPSULE | ORAL | 1 refills | Status: DC

## 2022-07-08 MED ORDER — ESOMEPRAZOLE MAGNESIUM 40 MG PO CPDR: 40.0000 mg | DELAYED_RELEASE_CAPSULE | Freq: Every day | ORAL | 1 refills | Status: DC

## 2022-07-08 NOTE — Progress Notes (Signed)
Subjective:  Patient ID: Natalie Hoffman, female    DOB: 1975/10/28  Age: 46 y.o. MRN: [id number]  Chief Complaint  Patient presents with   Hypertension    HPI  Pt presents for follow up of hypertension.  The patient is tolerating the medication well without side effects. Compliance with treatment has been good; including taking medication as directed , maintains a healthy diet and regular exercise regimen , and following up as directed. Currently on losartan 100mg  qd  Pt with history of GERD - currently on nexium 40mg  qd and stable  Pt with history of intertrigo - does use nizoral as needed - has had more trouble with rashes recently  Mixed hyperlipidemia  Pt presents with hyperlipidemia.  Compliance with treatment has been good The patient is compliant with medications, maintains a low cholesterol diet , follows up as directed , and maintains an exercise regimen . The patient denies experiencing any hypercholesterolemia related symptoms. Currently on crestor 5 mg qd  Pt with history of vit D def - stable on current supplement and due for labwork  Pt with history of restless leg syndrome - currently on mirapex 0.5mg  - requests refill  Pt with history of depression/anxiety - had done genesight and currently taking Pristiq 100mg  qd - states overall is doing well on this medication  Pt would like to be scheduled for screening colonoscopy  Current Outpatient Medications on File Prior to Visit  Medication Sig Dispense Refill   cetirizine (ZYRTEC) 10 MG tablet Take 10 mg by mouth daily.     clotrimazole-betamethasone (LOTRISONE) cream Apply 1 Application topically daily. 30 g 2   desvenlafaxine (PRISTIQ) 100 MG 24 hr tablet Take 1 tablet (100 mg total) by mouth daily. 30 tablet 0   ferrous sulfate 325 (65 FE) MG EC tablet Take 325 mg by mouth 3 (three) times daily with meals.     fluocinonide cream (LIDEX) 0.05 % Apply 1 Application topically 2 (two) times daily. 30 g 2    gabapentin (NEURONTIN) 300 MG capsule Take by mouth.     ketoconazole (NIZORAL) 2 % cream Apply 1 Application topically 2 (two) times daily. 15 g 1   losartan (COZAAR) 100 MG tablet TAKE 1 TABLET BY MOUTH EVERY DAY 90 tablet 0   Multiple Vitamins-Minerals (BARIATRIC MULTIVITAMINS/IRON) CAPS Take by mouth.     rosuvastatin (CRESTOR) 5 MG tablet TAKE 1 TABLET (5 MG TOTAL) BY MOUTH DAILY. 90 tablet 0   No current facility-administered medications on file prior to visit.   Past Medical History:  Diagnosis Date   Arthritis    Depression    GERD (gastroesophageal reflux disease)    Hypertension    Past Surgical History:  Procedure Laterality Date   BACK SURGERY  2012   CARDIAC CATHETERIZATION     negative study no CAD   CESAREAN SECTION     FLEXOR TENOTOMY  Left 12/19/2021   Procedure: LEFT BRACHIORADIALIS RELEASE;  Surgeon: , MD;  Location: [facility];  Service: Orthopedics;  Laterality: Left;  Block   HERNIA REPAIR     LAPAROSCOPIC GASTRIC SLEEVE RESECTION     OPEN REDUCTION INTERNAL FIXATION (ORIF) DISTAL RADIAL FRACTURE Left 12/19/2021   Procedure: OPEN REDUCTION INTERNAL FIXATION (ORIF) LEFT DISTAL RADIUS FRACTURE;  Surgeon: 12/21/2021, MD;  Location: [facility];  Service: Orthopedics;  Laterality: Left;  Block    Family History  Problem Relation Age of Onset   Arthritis Mother    Hypertension  Mother    Cancer Mother        VULVAR CANCER   Diabetes Mother    Hyperlipidemia Mother    Hypothyroidism Mother    Arthritis Father    Hypertension Father    Hyperlipidemia Father    Breast cancer Maternal Aunt    Heart attack Paternal Grandfather    Social History   Socioeconomic History   Marital status: Single    Spouse name: Not on file   Number of children: 1   Years of education: Not on file   Highest education level: Not on file  Occupational History   Not on file  Tobacco Use   Smoking status: Never   Smokeless tobacco:  Never  Vaping Use   Vaping Use: Never used  Substance and Sexual Activity   Alcohol use: Yes    Comment: OCCASIONAL   Drug use: Never   Sexual activity: Not on file  Other Topics Concern   Not on file  Social History Narrative   Not on file   Social Determinants of Health   Financial Resource Strain: Not on file  Food Insecurity: Not on file  Transportation Needs: Not on file  Physical Activity: Not on file  Stress: Not on file  Social Connections: Not on file    Review of Systems CONSTITUTIONAL: Negative for chills, fatigue, fever, unintentional weight gain and unintentional weight loss.  E/N/T: Negative for ear pain, nasal congestion and sore throat.  CARDIOVASCULAR: Negative for chest pain, dizziness, palpitations and pedal edema.  RESPIRATORY: Negative for recent cough and dyspnea.  GASTROINTESTINAL: Negative for abdominal pain, acid reflux symptoms, constipation, diarrhea, nausea and vomiting.  MSK: Negative for arthralgias and myalgias.  INTEGUMENTARY: see HPI NEUROLOGICAL: Negative for dizziness and headaches.  PSYCHIATRIC: see HPI      Objective:  PHYSICAL EXAM:   VS: BP 128/82 (BP Location: Left Arm, Patient Position: Sitting, Cuff Size: Large)   Pulse 96   Temp (!) 97.1 F (36.2 C) (Temporal)   Ht 5\' 2"  (1.575 m)   Wt 263 lb 3.2 oz (119.4 kg)   SpO2 98%   BMI 48.14 kg/m   GEN: Well nourished, well developed, in no acute distress  Cardiac: RRR; no murmurs, rubs, or gallops,no edema -  Respiratory:  normal respiratory rate and pattern with no distress - normal breath sounds with no rales, rhonchi, wheezes or rubs MS: no deformity or atrophy  Skin: intertrigo per pt Neuro:  Alert and Oriented x 3, Strength and sensation are intact - CN II-Xii grossly intact Psych: euthymic mood, appropriate affect and demeanor   Lab Results  Component Value Date   WBC 8.6 03/25/2022   HGB 15.2 03/25/2022   HCT 46.1 03/25/2022   PLT 324 03/25/2022   GLUCOSE 89  03/25/2022   CHOL 209 (H) 03/25/2022   TRIG 170 (H) 03/25/2022   HDL 62 03/25/2022   LDLCALC 117 (H) 03/25/2022   ALT 26 03/25/2022   AST 18 03/25/2022   NA 142 03/25/2022   K 4.4 03/25/2022   CL 103 03/25/2022   CREATININE 0.72 03/25/2022   BUN 11 03/25/2022   CO2 25 03/25/2022   TSH 1.500 03/25/2022      Assessment & Plan:   Problem List Items Addressed This Visit       Cardiovascular and Mediastinum   Essential hypertension   Relevant Orders   CBC with Differential/Platelet   Comprehensive metabolic panel Continue meds     Musculoskeletal and Integument  Intertrigo Recommend keep clean/dry Use nizoral as needed     Other   Depression, recurrent (HCC)   Relevant Orders   Thyroid Panel With TSH   Mixed hyperlipidemia - Primary   Relevant Orders   Lipid panel   Other Visit Diagnoses     Vitamin D deficiency       Relevant Medications   Vitamin D, Ergocalciferol, (DRISDOL) 1.25 MG (50000 UNIT) CAPS capsule   Other Relevant Orders   VITAMIN D 25 Hydroxy (Vit-D Deficiency, Fractures)   Gastroesophageal reflux disease without esophagitis       Relevant Medications   esomeprazole (NEXIUM) 40 MG capsule   Colon cancer screening       Relevant Orders   Ambulatory referral to Gastroenterology     .  Meds ordered this encounter  Medications   esomeprazole (NEXIUM) 40 MG capsule    Sig: Take 1 capsule (40 mg total) by mouth daily at 12 noon.    Dispense:  90 capsule    Refill:  1    Order Specific Question:   Supervising Provider    Answer:   Corey Harold   pramipexole (MIRAPEX) 0.5 MG tablet    Sig: Take 1 tablet (0.5 mg total) by mouth 3 (three) times daily.    Dispense:  270 tablet    Refill:  1    Order Specific Question:   Supervising Provider    Answer:   Corey Harold   Vitamin D, Ergocalciferol, (DRISDOL) 1.25 MG (50000 UNIT) CAPS capsule    Sig: Take 1 capsule (50,000 Units total) by mouth once a week.    Dispense:  12  capsule    Refill:  1    Order Specific Question:   Supervising Provider    AnswerCorey Harold    Orders Placed This Encounter  Procedures   CBC with Differential/Platelet   Comprehensive metabolic panel   Lipid panel   VITAMIN D 25 Hydroxy (Vit-D Deficiency, Fractures)   Thyroid Panel With TSH   Ambulatory referral to Gastroenterology     Follow-up: Return in about 4 months (around 11/07/2022) for chronic fasting follow up.  An After Visit Summary was printed and given to the patient.  Jettie Pagan [facility] [phone]

## 2022-07-09 ENCOUNTER — Other Ambulatory Visit: Payer: No Typology Code available for payment source

## 2022-07-09 ENCOUNTER — Other Ambulatory Visit: Payer: Self-pay | Admitting: Physician Assistant

## 2022-07-09 DIAGNOSIS — E782 Mixed hyperlipidemia: Secondary | ICD-10-CM

## 2022-07-09 DIAGNOSIS — F339 Major depressive disorder, recurrent, unspecified: Secondary | ICD-10-CM

## 2022-07-09 DIAGNOSIS — E559 Vitamin D deficiency, unspecified: Secondary | ICD-10-CM

## 2022-07-09 DIAGNOSIS — I10 Essential (primary) hypertension: Secondary | ICD-10-CM

## 2022-07-10 LAB — COMPREHENSIVE METABOLIC PANEL
ALT: 27 IU/L (ref 0–32)
AST: 21 IU/L (ref 0–40)
Albumin/Globulin Ratio: 1.8 (ref 1.2–2.2)
Albumin: 4.4 g/dL (ref 3.9–4.9)
Alkaline Phosphatase: 99 IU/L (ref 44–121)
BUN/Creatinine Ratio: 24 — ABNORMAL HIGH (ref 9–23)
BUN: 18 mg/dL (ref 6–24)
Bilirubin Total: 0.2 mg/dL (ref 0.0–1.2)
CO2: 24 mmol/L (ref 20–29)
Calcium: 9.8 mg/dL (ref 8.7–10.2)
Chloride: 104 mmol/L (ref 96–106)
Creatinine, Ser: 0.74 mg/dL (ref 0.57–1.00)
Globulin, Total: 2.4 g/dL (ref 1.5–4.5)
Glucose: 75 mg/dL (ref 70–99)
Potassium: 4.3 mmol/L (ref 3.5–5.2)
Sodium: 145 mmol/L — ABNORMAL HIGH (ref 134–144)
Total Protein: 6.8 g/dL (ref 6.0–8.5)
eGFR: 102 mL/min/{1.73_m2} (ref >59)

## 2022-07-10 LAB — THYROID PANEL WITH TSH
Free Thyroxine Index: 1.7 (ref 1.2–4.9)
T3 Uptake Ratio: 22 % — ABNORMAL LOW (ref 24–39)
T4, Total: 7.5 ug/dL (ref 4.5–12.0)
TSH: 2.5 u[IU]/mL (ref 0.450–4.500)

## 2022-07-10 LAB — CBC WITH DIFFERENTIAL/PLATELET
Basophils Absolute: 0.1 10*3/uL (ref 0.0–0.2)
Basos: 1 %
EOS (ABSOLUTE): 0.2 10*3/uL (ref 0.0–0.4)
Eos: 3 %
Hematocrit: 43.4 % (ref 34.0–46.6)
Hemoglobin: 14.2 g/dL (ref 11.1–15.9)
Immature Grans (Abs): 0 10*3/uL (ref 0.0–0.1)
Immature Granulocytes: 0 %
Lymphocytes Absolute: 2.9 10*3/uL (ref 0.7–3.1)
Lymphs: 37 %
MCH: 30.8 pg (ref 26.6–33.0)
MCHC: 32.7 g/dL (ref 31.5–35.7)
MCV: 94 fL (ref 79–97)
Monocytes Absolute: 0.6 10*3/uL (ref 0.1–0.9)
Monocytes: 7 %
Neutrophils Absolute: 4.2 10*3/uL (ref 1.4–7.0)
Neutrophils: 52 %
Platelets: 365 10*3/uL (ref 150–450)
RBC: 4.61 x10E6/uL (ref 3.77–5.28)
RDW: 12.5 % (ref 11.7–15.4)
WBC: 8 10*3/uL (ref 3.4–10.8)

## 2022-07-10 LAB — CARDIOVASCULAR RISK ASSESSMENT

## 2022-07-10 LAB — LIPID PANEL
Chol/HDL Ratio: 2.8 ratio (ref 0.0–4.4)
Cholesterol, Total: 183 mg/dL (ref 100–199)
HDL: 65 mg/dL (ref >39)
LDL Chol Calc (NIH): 93 mg/dL (ref 0–99)
Triglycerides: 148 mg/dL (ref 0–149)
VLDL Cholesterol Cal: 25 mg/dL (ref 5–40)

## 2022-07-10 LAB — VITAMIN D 25 HYDROXY (VIT D DEFICIENCY, FRACTURES): Vit D, 25-Hydroxy: 60.2 ng/mL (ref 30.0–100.0)

## 2022-07-14 LAB — HM MAMMOGRAPHY

## 2022-07-17 ENCOUNTER — Encounter: Payer: Self-pay | Admitting: Physician Assistant

## 2022-07-20 ENCOUNTER — Other Ambulatory Visit: Payer: Self-pay | Admitting: Physician Assistant

## 2022-07-21 ENCOUNTER — Other Ambulatory Visit: Payer: Self-pay | Admitting: Physician Assistant

## 2022-07-21 DIAGNOSIS — F339 Major depressive disorder, recurrent, unspecified: Secondary | ICD-10-CM

## 2022-07-21 MED ORDER — DESVENLAFAXINE SUCCINATE ER 100 MG PO TB24: 100.0000 mg | ORAL_TABLET | Freq: Every day | ORAL | 0 refills | Status: DC

## 2022-08-11 ENCOUNTER — Ambulatory Visit: Payer: No Typology Code available for payment source | Admitting: Podiatry
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
## 2022-09-07 ENCOUNTER — Other Ambulatory Visit: Payer: Self-pay | Admitting: Physician Assistant

## 2022-09-30 ENCOUNTER — Ambulatory Visit (INDEPENDENT_AMBULATORY_CARE_PROVIDER_SITE_OTHER): Payer: No Typology Code available for payment source | Admitting: Physician Assistant

## 2022-09-30 ENCOUNTER — Encounter: Payer: Self-pay | Admitting: Physician Assistant

## 2022-09-30 VITALS — BP 126/80 | HR 96 | Temp 96.9°F | Ht 62.0 in | Wt 270.0 lb

## 2022-09-30 DIAGNOSIS — J029 Acute pharyngitis, unspecified: Secondary | ICD-10-CM

## 2022-09-30 DIAGNOSIS — J060 Acute laryngopharyngitis: Secondary | ICD-10-CM | POA: Diagnosis not present

## 2022-09-30 MED ORDER — LEVOCETIRIZINE DIHYDROCHLORIDE 5 MG PO TABS: 5.0000 mg | ORAL_TABLET | Freq: Every evening | ORAL | 2 refills | Status: DC

## 2022-09-30 MED ORDER — FLUTICASONE PROPIONATE 50 MCG/ACT NA SUSP: 2.0000 | Freq: Every day | NASAL | 6 refills | Status: DC

## 2022-09-30 NOTE — Progress Notes (Signed)
Acute Office Visit  Subjective:    Patient ID: Natalie Hoffman, female    DOB: 12/27/1975, 47 y.o.   MRN: [id number]  Chief Complaint  Patient presents with   Headache   Fatigue   Sore Throat   Chills    HPI: Patient is in today for persistent scratchy throat, pnd and ears popping intermittently over the past 5 weeks.  She was seen by urgent care about a month ago and tested negative for strep.  She took astelin spray which did not help and stopped zyrtec because she felt like that was not helping.  About 2 weeks ago she had leftover prednisone and took it as well.  Denies fever.  No productive cough  Past Medical History:  Diagnosis Date   Arthritis    Depression    GERD (gastroesophageal reflux disease)    Hypertension     Past Surgical History:  Procedure Laterality Date   BACK SURGERY  2012   CARDIAC CATHETERIZATION     negative study no CAD   CESAREAN SECTION     FLEXOR TENOTOMY  Left 12/19/2021   Procedure: LEFT BRACHIORADIALIS RELEASE;  Surgeon: Leanora Cover, MD;  Location: [place];  Service: Orthopedics;  Laterality: Left;  Block   HERNIA REPAIR     LAPAROSCOPIC GASTRIC SLEEVE RESECTION     OPEN REDUCTION INTERNAL FIXATION (ORIF) DISTAL RADIAL FRACTURE Left 12/19/2021   Procedure: OPEN REDUCTION INTERNAL FIXATION (ORIF) LEFT DISTAL RADIUS FRACTURE;  Surgeon: Leanora Cover, MD;  Location: [place];  Service: Orthopedics;  Laterality: Left;  Block    Family History  Problem Relation Age of Onset   Arthritis Mother    Hypertension Mother    Cancer Mother        VULVAR CANCER   Diabetes Mother    Hyperlipidemia Mother    Hypothyroidism Mother    Arthritis Father    Hypertension Father    Hyperlipidemia Father    Breast cancer Maternal Aunt    Heart attack Paternal Grandfather     Social History   Socioeconomic History   Marital status: Single    Spouse name: Not on file   Number of children: 1   Years of  education: Not on file   Highest education level: Not on file  Occupational History   Not on file  Tobacco Use   Smoking status: Never   Smokeless tobacco: Never  Vaping Use   Vaping Use: Never used  Substance and Sexual Activity   Alcohol use: Yes    Comment: OCCASIONAL   Drug use: Never   Sexual activity: Not on file  Other Topics Concern   Not on file  Social History Narrative   Not on file   Social Determinants of Health   Financial Resource Strain: Not on file  Food Insecurity: Not on file  Transportation Needs: Not on file  Physical Activity: Not on file  Stress: Not on file  Social Connections: Not on file  Intimate Partner Violence: Not on file    Outpatient Medications Prior to Visit  Medication Sig Dispense Refill   clotrimazole-betamethasone (LOTRISONE) cream Apply 1 Application topically daily. 30 g 2   desvenlafaxine (PRISTIQ) 100 MG 24 hr tablet Take 1 tablet (100 mg total) by mouth daily. 90 tablet 0   esomeprazole (NEXIUM) 40 MG capsule Take 1 capsule (40 mg total) by mouth daily at 12 noon. 90 capsule 1   ferrous sulfate 325 (65 FE) MG EC  tablet Take 325 mg by mouth 3 (three) times daily with meals.     fluocinonide cream (LIDEX) [id number] % Apply 1 Application topically 2 (two) times daily. 30 g 2   ketoconazole (NIZORAL) 2 % cream Apply 1 Application topically 2 (two) times daily. 15 g 1   losartan (COZAAR) 100 MG tablet TAKE 1 TABLET BY MOUTH EVERY DAY 90 tablet 0   Multiple Vitamins-Minerals (BARIATRIC MULTIVITAMINS/IRON) CAPS Take by mouth.     pramipexole (MIRAPEX) 0.5 MG tablet Take 1 tablet (0.5 mg total) by mouth 3 (three) times daily. 270 tablet 1   rosuvastatin (CRESTOR) 5 MG tablet TAKE 1 TABLET (5 MG TOTAL) BY MOUTH DAILY. 90 tablet 0   Vitamin D, Ergocalciferol, (DRISDOL) 1.25 MG (50000 UNIT) CAPS capsule Take 1 capsule (50,000 Units total) by mouth once a week. 12 capsule 1   cetirizine (ZYRTEC) 10 MG tablet Take 10 mg by mouth daily.      gabapentin (NEURONTIN) 300 MG capsule Take by mouth. (Patient not taking: Reported on 09/30/2022)     No facility-administered medications prior to visit.    Allergies  Allergen Reactions   Penicillins Rash    Review of Systems CONSTITUTIONAL: Negative for chills, fatigue, fever,  E/N/T: see HPI CARDIOVASCULAR: Negative for chest pain, dizziness,  RESPIRATORY: minimal cough - no wheezing GASTROINTESTINAL: Negative for abdominal pain,constipation, diarrhea, nausea and vomiting.       Objective:   PHYSICAL EXAM:   VS: BP 126/80 (BP Location: Left Arm, Patient Position: Sitting, Cuff Size: Large)   Pulse 96   Temp (!) 96.9 F (36.1 C) (Temporal)   Ht '5\' 2"'$  (1.575 m)   Wt 270 lb (122.5 kg)   SpO2 95%   BMI 49.38 kg/m   GEN: Well nourished, well developed, in no acute distress  HEENT: normal external ears and nose - normal external auditory canals and TMS - hearing grossly normal - normal nasal mucosa and septum - Lips, Teeth and Gums - normal  Oropharynx - normal mucosa, palate, and posterior pharynx Cardiac: RRR; no murmurs,  Respiratory:  normal respiratory rate and pattern with no distress - normal breath sounds with no rales, rhonchi, wheezes or rubs       09/30/2022    2:34 PM 07/08/2022   10:02 AM 05/02/2022   10:17 AM  Vitals with BMI  Height '5\' 2"'$  '5\' 2"'$  '5\' 2"'$   Weight 270 lbs 263 lbs 3 oz 255 lbs 10 oz  BMI 49.37 [id number]  Systolic 123XX123 0000000 0000000  Diastolic 80 82 88  Pulse 96 96 78    Orthostatic VS for the past 72 hrs (Last 3 readings):  Patient Position BP Location Cuff Size  09/30/22 1434 Sitting Left Arm Large     Physical Exam  Health Maintenance Due  Topic Date Due   PAP SMEAR-Modifier  Never done   COLONOSCOPY (Pts 45-40yr Insurance coverage will need to be confirmed)  Never done   COVID-19 Vaccine (7 - 2023-24 season) 04/04/2022    There are no preventive care reminders to display for this patient.   Lab Results  Component Value Date    TSH 2.500 07/09/2022   Lab Results  Component Value Date   WBC 8.0 07/09/2022   HGB 14.2 07/09/2022   HCT 43.4 07/09/2022   MCV 94 07/09/2022   PLT 365 07/09/2022   Lab Results  Component Value Date   NA 145 (H) 07/09/2022   K 4.3 07/09/2022   CO2 24 07/09/2022  GLUCOSE 75 07/09/2022   BUN 18 07/09/2022   CREATININE 0.74 07/09/2022   BILITOT 0.2 07/09/2022   ALKPHOS 99 07/09/2022   AST 21 07/09/2022   ALT 27 07/09/2022   PROT 6.8 07/09/2022   ALBUMIN 4.4 07/09/2022   CALCIUM 9.8 07/09/2022   EGFR 102 07/09/2022   Lab Results  Component Value Date   CHOL 183 07/09/2022   Lab Results  Component Value Date   HDL 65 07/09/2022   Lab Results  Component Value Date   LDLCALC 93 07/09/2022   Lab Results  Component Value Date   TRIG 148 07/09/2022   Lab Results  Component Value Date   CHOLHDL 2.8 07/09/2022   No results found for: "HGBA1C"     Assessment & Plan:  No problem-specific Assessment & Plan notes found for this encounter.    Meds ordered this encounter  Medications   levocetirizine (XYZAL) 5 MG tablet    Sig: Take 1 tablet (5 mg total) by mouth every evening.    Dispense:  30 tablet    Refill:  2    Order Specific Question:   Supervising Provider    Answer:   Shelton Silvas   fluticasone (FLONASE) 50 MCG/ACT nasal spray    Sig: Place 2 sprays into both nostrils daily.    Dispense:  16 g    Refill:  6    Order Specific Question:   Supervising Provider    AnswerShelton Silvas    Orders Placed This Encounter  Procedures   Viral Cult, Rapid, Respiratory   CBC with Differential/Platelet   EPSTEIN-BARR VIRUS (EBV) Antibody Profile     Follow-up: Return if symptoms worsen or fail to improve.  An After Visit Summary was printed and given to the patient.  Yetta Flock [facility] [phone]

## 2022-10-01 LAB — CBC WITH DIFFERENTIAL/PLATELET
Basophils Absolute: 0 10*3/uL (ref 0.0–0.2)
Basos: 0 %
EOS (ABSOLUTE): 0 10*3/uL (ref 0.0–0.4)
Eos: 0 %
Hematocrit: 43.4 % (ref 34.0–46.6)
Hemoglobin: 14.5 g/dL (ref 11.1–15.9)
Immature Grans (Abs): 0 10*3/uL (ref 0.0–0.1)
Immature Granulocytes: 0 %
Lymphocytes Absolute: 2.2 10*3/uL (ref 0.7–3.1)
Lymphs: 29 %
MCH: 30.9 pg (ref 26.6–33.0)
MCHC: 33.4 g/dL (ref 31.5–35.7)
MCV: 93 fL (ref 79–97)
Monocytes Absolute: 0.5 10*3/uL (ref 0.1–0.9)
Monocytes: 7 %
Neutrophils Absolute: 4.9 10*3/uL (ref 1.4–7.0)
Neutrophils: 64 %
Platelets: 359 10*3/uL (ref 150–450)
RBC: 4.69 x10E6/uL (ref 3.77–5.28)
RDW: 12.5 % (ref 11.7–15.4)
WBC: 7.7 10*3/uL (ref 3.4–10.8)

## 2022-10-01 LAB — EPSTEIN-BARR VIRUS (EBV) ANTIBODY PROFILE
EBV NA IgG: >600 U/mL — ABNORMAL HIGH (ref 0.0–17.9)
EBV VCA IgG: >600 U/mL — ABNORMAL HIGH (ref 0.0–17.9)
EBV VCA IgM: <36 U/mL (ref 0.0–35.9)

## 2022-10-03 ENCOUNTER — Other Ambulatory Visit: Payer: Self-pay | Admitting: Physician Assistant

## 2022-10-03 DIAGNOSIS — I10 Essential (primary) hypertension: Secondary | ICD-10-CM

## 2022-10-03 LAB — VIRAL CULT, RAPID, RESPIRATORY
Adenovirus: NEGATIVE
Influenza A: NEGATIVE
Influenza B: NEGATIVE
Parainfluenza 1: NEGATIVE
Parainfluenza 2: NEGATIVE
Parainfluenza 3: NEGATIVE
Respiratory Syncytial Virus: NEGATIVE

## 2022-10-13 ENCOUNTER — Encounter: Payer: Self-pay | Admitting: Physician Assistant

## 2022-10-13 ENCOUNTER — Ambulatory Visit (INDEPENDENT_AMBULATORY_CARE_PROVIDER_SITE_OTHER): Payer: No Typology Code available for payment source | Admitting: Physician Assistant

## 2022-10-13 VITALS — BP 128/86 | HR 90 | Temp 97.3°F | Ht 62.0 in | Wt 269.8 lb

## 2022-10-13 DIAGNOSIS — E559 Vitamin D deficiency, unspecified: Secondary | ICD-10-CM | POA: Diagnosis not present

## 2022-10-13 DIAGNOSIS — K219 Gastro-esophageal reflux disease without esophagitis: Secondary | ICD-10-CM | POA: Diagnosis not present

## 2022-10-13 DIAGNOSIS — E782 Mixed hyperlipidemia: Secondary | ICD-10-CM | POA: Diagnosis not present

## 2022-10-13 DIAGNOSIS — I10 Essential (primary) hypertension: Secondary | ICD-10-CM | POA: Diagnosis not present

## 2022-10-13 DIAGNOSIS — F339 Major depressive disorder, recurrent, unspecified: Secondary | ICD-10-CM

## 2022-10-13 MED ORDER — VORTIOXETINE HBR 10 MG PO TABS: 10.0000 mg | ORAL_TABLET | Freq: Every day | ORAL | 0 refills | Status: DC

## 2022-10-14 ENCOUNTER — Encounter: Payer: Self-pay | Admitting: Physician Assistant

## 2022-10-14 LAB — COMPREHENSIVE METABOLIC PANEL
ALT: 37 IU/L — ABNORMAL HIGH (ref 0–32)
AST: 27 IU/L (ref 0–40)
Albumin/Globulin Ratio: 1.7 (ref 1.2–2.2)
Albumin: 4.4 g/dL (ref 3.9–4.9)
Alkaline Phosphatase: 90 IU/L (ref 44–121)
BUN/Creatinine Ratio: 17 (ref 9–23)
BUN: 12 mg/dL (ref 6–24)
Bilirubin Total: 0.3 mg/dL (ref 0.0–1.2)
CO2: 26 mmol/L (ref 20–29)
Calcium: 9.5 mg/dL (ref 8.7–10.2)
Chloride: 102 mmol/L (ref 96–106)
Creatinine, Ser: 0.69 mg/dL (ref 0.57–1.00)
Globulin, Total: 2.6 g/dL (ref 1.5–4.5)
Glucose: 89 mg/dL (ref 70–99)
Potassium: 4.3 mmol/L (ref 3.5–5.2)
Sodium: 143 mmol/L (ref 134–144)
Total Protein: 7 g/dL (ref 6.0–8.5)
eGFR: 108 mL/min/{1.73_m2} (ref >59)

## 2022-10-14 LAB — CBC WITH DIFFERENTIAL/PLATELET
Basophils Absolute: 0 10*3/uL (ref 0.0–0.2)
Basos: 1 %
EOS (ABSOLUTE): 0 10*3/uL (ref 0.0–0.4)
Eos: 0 %
Hematocrit: 43.2 % (ref 34.0–46.6)
Hemoglobin: 14.4 g/dL (ref 11.1–15.9)
Immature Grans (Abs): 0 10*3/uL (ref 0.0–0.1)
Immature Granulocytes: 0 %
Lymphocytes Absolute: 2.8 10*3/uL (ref 0.7–3.1)
Lymphs: 41 %
MCH: 30.6 pg (ref 26.6–33.0)
MCHC: 33.3 g/dL (ref 31.5–35.7)
MCV: 92 fL (ref 79–97)
Monocytes Absolute: 0.4 10*3/uL (ref 0.1–0.9)
Monocytes: 6 %
Neutrophils Absolute: 3.5 10*3/uL (ref 1.4–7.0)
Neutrophils: 52 %
Platelets: 346 10*3/uL (ref 150–450)
RBC: 4.71 x10E6/uL (ref 3.77–5.28)
RDW: 12.7 % (ref 11.7–15.4)
WBC: 6.8 10*3/uL (ref 3.4–10.8)

## 2022-10-14 LAB — LIPID PANEL
Chol/HDL Ratio: 3.6 ratio (ref 0.0–4.4)
Cholesterol, Total: 187 mg/dL (ref 100–199)
HDL: 52 mg/dL (ref >39)
LDL Chol Calc (NIH): 98 mg/dL (ref 0–99)
Triglycerides: 221 mg/dL — ABNORMAL HIGH (ref 0–149)
VLDL Cholesterol Cal: 37 mg/dL (ref 5–40)

## 2022-10-14 LAB — CARDIOVASCULAR RISK ASSESSMENT

## 2022-10-14 LAB — VITAMIN D 25 HYDROXY (VIT D DEFICIENCY, FRACTURES): Vit D, 25-Hydroxy: 62.9 ng/mL (ref 30.0–100.0)

## 2022-10-14 LAB — TSH: TSH: 1.91 u[IU]/mL (ref 0.450–4.500)

## 2022-10-14 NOTE — Progress Notes (Signed)
Subjective:  Patient ID: Natalie Hoffman, female    DOB: 02-11-1976  Age: 47 y.o. MRN: [id number]  Chief Complaint  Patient presents with   Hypertension   Depression    Hypertension  Depression         Pt presents for follow up of hypertension.  The patient is tolerating the medication well without side effects. Compliance with treatment has been good; including taking medication as directed  , and following up as directed. Currently on losartan '100mg'$  qd  Pt with history of GERD - currently on nexium '40mg'$  qd and stable   Mixed hyperlipidemia  Pt presents with hyperlipidemia.  Compliance with treatment has been good The patient is compliant with medications, maintains a low cholesterol diet , follows up as directed , and maintains an exercise regimen . The patient denies experiencing any hypercholesterolemia related symptoms. Currently on crestor 5 mg qd  Pt with history of vit D def - stable on current supplement and due for labwork  Pt with history of restless leg syndrome - currently on mirapex 0.'5mg'$  -  Pt with history of depression/anxiety - had done genesight and currently taking Pristiq '100mg'$  qd - states she feels now this medication is not working at all for her depression - I discussed the fact because she has tried multiple medications would prefer she see psychiatrist - she states she would like to try another medication first She has tried and failed in the past: Wellbutrin (anger), lexapro 10 (tried twice), zoloft 25, paxil 20, amitriptyline 25, cymbalta 30 (tired), xanax, effexor (anger) and now pristiq '100mg'$  She has not been following up with her counselor   Current Outpatient Medications on File Prior to Visit  Medication Sig Dispense Refill   clotrimazole-betamethasone (LOTRISONE) cream Apply 1 Application topically daily. 30 g 2   esomeprazole (NEXIUM) 40 MG capsule Take 1 capsule (40 mg total) by mouth daily at 12 noon. 90 capsule 1   ferrous sulfate  325 (65 FE) MG EC tablet Take 325 mg by mouth 3 (three) times daily with meals.     fluocinonide cream (LIDEX) [id number] % Apply 1 Application topically 2 (two) times daily. 30 g 2   fluticasone (FLONASE) 50 MCG/ACT nasal spray Place 2 sprays into both nostrils daily. 16 g 6   gabapentin (NEURONTIN) 300 MG capsule Take by mouth.     levocetirizine (XYZAL) 5 MG tablet Take 1 tablet (5 mg total) by mouth every evening. 30 tablet 2   losartan (COZAAR) 100 MG tablet TAKE 1 TABLET BY MOUTH EVERY DAY 90 tablet 0   Multiple Vitamins-Minerals (BARIATRIC MULTIVITAMINS/IRON) CAPS Take by mouth.     pramipexole (MIRAPEX) 0.5 MG tablet Take 1 tablet (0.5 mg total) by mouth 3 (three) times daily. 270 tablet 1   rosuvastatin (CRESTOR) 5 MG tablet TAKE 1 TABLET (5 MG TOTAL) BY MOUTH DAILY. 90 tablet 0   Vitamin D, Ergocalciferol, (DRISDOL) 1.25 MG (50000 UNIT) CAPS capsule Take 1 capsule (50,000 Units total) by mouth once a week. 12 capsule 1   No current facility-administered medications on file prior to visit.   Past Medical History:  Diagnosis Date   Arthritis    Depression    GERD (gastroesophageal reflux disease)    Hypertension    Past Surgical History:  Procedure Laterality Date   BACK SURGERY  2012   CARDIAC CATHETERIZATION     negative study no CAD   CESAREAN SECTION     FLEXOR TENOTOMY  Left 12/19/2021  Procedure: LEFT BRACHIORADIALIS RELEASE;  Surgeon: Leanora Cover, MD;  Location: [place];  Service: Orthopedics;  Laterality: Left;  Block   HERNIA REPAIR     LAPAROSCOPIC GASTRIC SLEEVE RESECTION     OPEN REDUCTION INTERNAL FIXATION (ORIF) DISTAL RADIAL FRACTURE Left 12/19/2021   Procedure: OPEN REDUCTION INTERNAL FIXATION (ORIF) LEFT DISTAL RADIUS FRACTURE;  Surgeon: Leanora Cover, MD;  Location: [place];  Service: Orthopedics;  Laterality: Left;  Block    Family History  Problem Relation Age of Onset   Arthritis Mother    Hypertension Mother    Cancer  Mother        VULVAR CANCER   Diabetes Mother    Hyperlipidemia Mother    Hypothyroidism Mother    Arthritis Father    Hypertension Father    Hyperlipidemia Father    Breast cancer Maternal Aunt    Heart attack Paternal Grandfather    Social History   Socioeconomic History   Marital status: Single    Spouse name: Not on file   Number of children: 1   Years of education: Not on file   Highest education level: Not on file  Occupational History   Not on file  Tobacco Use   Smoking status: Never   Smokeless tobacco: Never  Vaping Use   Vaping Use: Never used  Substance and Sexual Activity   Alcohol use: Yes    Comment: OCCASIONAL   Drug use: Never   Sexual activity: Not on file  Other Topics Concern   Not on file  Social History Narrative   Not on file   Social Determinants of Health   Financial Resource Strain: Not on file  Food Insecurity: Not on file  Transportation Needs: Not on file  Physical Activity: Not on file  Stress: Not on file  Social Connections: Not on file    Review of Systems  Psychiatric/Behavioral:  Positive for depression.    CONSTITUTIONAL: Negative for chills, fatigue, fever, unintentional weight gain and unintentional weight loss.  E/N/T: Negative for ear pain, nasal congestion and sore throat.  CARDIOVASCULAR: Negative for chest pain, dizziness, palpitations and pedal edema.  RESPIRATORY: Negative for recent cough and dyspnea.  GASTROINTESTINAL: Negative for abdominal pain, acid reflux symptoms, constipation, diarrhea, nausea and vomiting.  MSK: Negative for arthralgias and myalgias.  INTEGUMENTARY: Negative for rash.  NEUROLOGICAL: Negative for dizziness and headaches.  PSYCHIATRIC:see HPI      Objective:  PHYSICAL EXAM:   VS: BP 128/86 (BP Location: Left Arm, Patient Position: Sitting, Cuff Size: Large)   Pulse 90   Temp (!) 97.3 F (36.3 C) (Temporal)   Ht '5\' 2"'$  (1.575 m)   Wt 269 lb 12.8 oz (122.4 kg)   SpO2 95%   BMI  49.35 kg/m   GEN: Well nourished, well developed, in no acute distress  Cardiac: RRR; no murmurs, rubs, or gallops,no edema -  Respiratory:  normal respiratory rate and pattern with no distress - normal breath sounds with no rales, rhonchi, wheezes or rubs MS: no deformity or atrophy  Skin: warm and dry, no rash  Psych: euthymic mood, appropriate affect and demeanor  Lab Results  Component Value Date   WBC 6.8 10/13/2022   HGB 14.4 10/13/2022   HCT 43.2 10/13/2022   PLT 346 10/13/2022   GLUCOSE 89 10/13/2022   CHOL 187 10/13/2022   TRIG 221 (H) 10/13/2022   HDL 52 10/13/2022   LDLCALC 98 10/13/2022   ALT 37 (H) 10/13/2022  AST 27 10/13/2022   NA 143 10/13/2022   K 4.3 10/13/2022   CL 102 10/13/2022   CREATININE 0.69 10/13/2022   BUN 12 10/13/2022   CO2 26 10/13/2022   TSH 1.910 10/13/2022      Assessment & Plan:   Problem List Items Addressed This Visit       Cardiovascular and Mediastinum   Essential hypertension   Relevant Orders   CBC with Differential/Platelet   Comprehensive metabolic panel Continue meds             Other   Depression, recurrent ([place])   Relevant Orders   TSH Stop pristiq Trial trintillex -'10mg'$  qd samples given   Mixed hyperlipidemia - Primary   Relevant Orders   Lipid panel   Other Visit Diagnoses     Vitamin D deficiency       Relevant Medications   Vitamin D, Ergocalciferol, (DRISDOL) 1.25 MG (50000 UNIT) CAPS capsule   Other Relevant Orders   VITAMIN D 25 Hydroxy (Vit-D Deficiency, Fractures)   Gastroesophageal reflux disease without esophagitis       Relevant Medications   esomeprazole (NEXIUM) 40 MG capsule              .  Meds ordered this encounter  Medications   vortioxetine HBr (TRINTELLIX) 10 MG TABS tablet    Sig: Take 1 tablet (10 mg total) by mouth daily.    Dispense:  30 tablet    Refill:  0    Order Specific Question:   Supervising Provider    AnswerShelton Silvas    Orders Placed  This Encounter  Procedures   CBC with Differential/Platelet   Comprehensive metabolic panel   TSH   Lipid panel   VITAMIN D 25 Hydroxy (Vit-D Deficiency, Fractures)   Cardiovascular Risk Assessment     Follow-up: Return in about 3 months (around 01/13/2023) for follow up.  An After Visit Summary was printed and given to the patient.  Yetta Flock [facility] [phone]

## 2022-10-23 LAB — HM COLONOSCOPY

## 2022-10-28 ENCOUNTER — Other Ambulatory Visit: Payer: Self-pay

## 2022-10-28 ENCOUNTER — Other Ambulatory Visit: Payer: Self-pay | Admitting: Physician Assistant

## 2022-10-28 DIAGNOSIS — F339 Major depressive disorder, recurrent, unspecified: Secondary | ICD-10-CM

## 2022-10-28 MED ORDER — VORTIOXETINE HBR 10 MG PO TABS: 10.0000 mg | ORAL_TABLET | Freq: Every day | ORAL | 0 refills | Status: DC

## 2022-11-10 ENCOUNTER — Ambulatory Visit: Payer: No Typology Code available for payment source | Admitting: Physician Assistant

## 2022-11-13 ENCOUNTER — Telehealth: Payer: Self-pay

## 2022-11-13 NOTE — Telephone Encounter (Signed)
Patient called and stated that she is requesting referral to [facility] in [place]. She states that she has not seen one in a while but she is wanting to see one again for her Arthritis. Please advise.

## 2022-11-14 NOTE — Telephone Encounter (Signed)
I do not see in her records a history of rheumatoid arthritis -- does she have this diagnosis? - if so who did she see in past and I need those records

## 2022-11-14 NOTE — Telephone Encounter (Signed)
Left message to return call back. 

## 2022-11-18 NOTE — Telephone Encounter (Signed)
Patient stated she was dx with psoriatic arthritis when she was being seen in [place]. Please advise

## 2022-11-24 NOTE — Telephone Encounter (Signed)
Patient made aware, will stop by office and fill out record release.

## 2022-12-01 ENCOUNTER — Other Ambulatory Visit: Payer: Self-pay | Admitting: Physician Assistant

## 2022-12-13 ENCOUNTER — Other Ambulatory Visit: Payer: Self-pay | Admitting: Physician Assistant

## 2022-12-13 DIAGNOSIS — J029 Acute pharyngitis, unspecified: Secondary | ICD-10-CM

## 2022-12-13 DIAGNOSIS — J060 Acute laryngopharyngitis: Secondary | ICD-10-CM

## 2022-12-15 ENCOUNTER — Other Ambulatory Visit: Payer: Self-pay | Admitting: Family Medicine

## 2022-12-15 ENCOUNTER — Telehealth: Payer: Self-pay

## 2022-12-15 DIAGNOSIS — F339 Major depressive disorder, recurrent, unspecified: Secondary | ICD-10-CM

## 2022-12-15 MED ORDER — DESVENLAFAXINE SUCCINATE ER 100 MG PO TB24: 100.0000 mg | ORAL_TABLET | Freq: Every day | ORAL | 1 refills | Status: DC

## 2022-12-15 NOTE — Telephone Encounter (Signed)
Patient called stating that She was supposed to call back to let Natalie Hoffman know if the RX for Trintellix is working for her, and she states that it is not working and she wants to go back to taking Pristiq that she was previously on and is ok for referral to be sent to Psychiatry which is what she stated that her and Natalie Hoffman had spoken about before. Please advise.

## 2022-12-15 NOTE — Telephone Encounter (Signed)
I spoke with patient. Trintillex is not helping. Restarted pristiq 100 mg daily.  Sent referral for psychiatry. Dr. Sedalia Muta

## 2022-12-16 ENCOUNTER — Encounter: Payer: Self-pay | Admitting: Physician Assistant

## 2022-12-21 ENCOUNTER — Other Ambulatory Visit: Payer: Self-pay | Admitting: Physician Assistant

## 2022-12-21 DIAGNOSIS — E559 Vitamin D deficiency, unspecified: Secondary | ICD-10-CM
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
## 2023-01-03 ENCOUNTER — Ambulatory Visit (HOSPITAL_BASED_OUTPATIENT_CLINIC_OR_DEPARTMENT_OTHER): Payer: No Typology Code available for payment source | Admitting: Psychiatry

## 2023-01-03 ENCOUNTER — Encounter (HOSPITAL_COMMUNITY): Payer: Self-pay | Admitting: Psychiatry

## 2023-01-03 DIAGNOSIS — F341 Dysthymic disorder: Secondary | ICD-10-CM

## 2023-01-03 MED ORDER — FLUOXETINE HCL 20 MG PO CAPS: 20.0000 mg | ORAL_CAPSULE | Freq: Every day | ORAL | 2 refills | Status: DC

## 2023-01-03 NOTE — Patient Instructions (Signed)
Thank you for attending your appointment today.  -- STOP Pristiq - if experiencing withdrawal symptoms upon stopping, you can decrease to 50 mg for 1 week then stop -- START Prozac 20 mg daily -- Continue other medications as prescribed.  Please do not make any changes to medications without first discussing with your provider. If you are experiencing a psychiatric emergency, please call 911 or present to your nearest emergency department. Additional crisis, medication management, and therapy resources are included below.  [facility]  [address], [place], [place] [address] [phone] WALK-IN URGENT CARE 24/7 FOR ANYONE [address], [place], [place]  [phone] Fax: [phone] [email or web address] *Interpreters available *Accepts all insurance and uninsured for Urgent Care needs *Accepts Medicaid and uninsured for outpatient treatment (below)      ONLY FOR [facility]  Below:    Outpatient New Patient Assessment/Therapy Walk-ins:        Monday -Thursday 8am until slots are full.        Every Friday 1pm-4pm  (first come, first served)                   New Patient Psychiatry/Medication Management        Monday-Friday 8am-11am (first come, first served)               For all walk-ins we ask that you arrive by 7:15am, because patients will be seen in the order of arrival.

## 2023-01-05 ENCOUNTER — Telehealth (HOSPITAL_COMMUNITY): Payer: Self-pay | Admitting: Psychiatry

## 2023-01-08 ENCOUNTER — Other Ambulatory Visit: Payer: Self-pay | Admitting: Physician Assistant

## 2023-01-08 ENCOUNTER — Encounter: Payer: Self-pay | Admitting: Physician Assistant

## 2023-01-08 ENCOUNTER — Ambulatory Visit (INDEPENDENT_AMBULATORY_CARE_PROVIDER_SITE_OTHER): Payer: No Typology Code available for payment source | Admitting: Physician Assistant

## 2023-01-08 VITALS — BP 130/84 | HR 99 | Temp 97.5°F | Ht 62.0 in | Wt 269.0 lb

## 2023-01-08 DIAGNOSIS — Z0000 Encounter for general adult medical examination without abnormal findings: Secondary | ICD-10-CM | POA: Diagnosis not present

## 2023-01-08 DIAGNOSIS — K219 Gastro-esophageal reflux disease without esophagitis: Secondary | ICD-10-CM

## 2023-01-08 NOTE — Progress Notes (Signed)
Subjective:  Patient ID: Natalie Hoffman, female    DOB: 18-Jan-1976  Age: 47 y.o. MRN: [id number]  Chief Complaint  Patient presents with   Annual Exam    HPI Well Adult Physical: Patient here for a comprehensive physical exam.The patient reports no problems Do you take any herbs or supplements that were not prescribed by a doctor? no Are you taking calcium supplements? no Are you taking aspirin daily? no  Encounter for general adult medical examination without abnormal findings  Physical ("At Risk" items are starred): Patient's last physical exam was 1 year ago .  Patient is not afflicted from Stress Incontinence and Urge Incontinence  Patient wears a seat belts Patient has smoke detectors and has carbon monoxide detectors. Patient practices appropriate gun safety. Patient wears sunscreen with extended sun exposure. Dental Care: brushes and flosses daily. Last dental visit: up to date Vision impairments: has glasses Ophthalmology/Optometry: Annual visit.  Hearing loss: none  No LMP recorded. Patient is postmenopausal. Safe at home: Yes Self breast exams: Yes Last pap: unknown Last mammogram: 12/23     01/08/2023    2:06 PM 10/13/2022    2:04 PM 07/08/2022   10:03 AM 05/02/2022   10:35 AM 03/25/2022    3:06 PM  Depression screen PHQ 2/9  Decreased Interest 2 3 1 1 3   Down, Depressed, Hopeless 2 2 1 1 3   PHQ - 2 Score 4 5 2 2 6   Altered sleeping 3 3 3 2 2   Tired, decreased energy 3 3 3 3 3   Change in appetite 2 3 0 2 3  Feeling bad or failure about yourself  1 2 0 1 2  Trouble concentrating 0 2 0 0 2  Moving slowly or fidgety/restless 0 0 0 0 2  Suicidal thoughts 1 1 0 1 1  PHQ-9 Score 14 19 8 11 21   Difficult doing work/chores Very difficult Somewhat difficult Not difficult at all Somewhat difficult Somewhat difficult         12/19/2021    9:38 AM 12/31/2021    2:55 PM 07/08/2022   10:04 AM 10/13/2022    2:04 PM 01/08/2023    2:06 PM  Fall Risk  Falls in the  past year?  0 0 0 0  Was there an injury with Fall?  0 0 0 0  Fall Risk Category Calculator  0 0 0 0  Fall Risk Category (Retired)  Low Low    (RETIRED) Patient Fall Risk Level Moderate fall risk Low fall risk Low fall risk    Patient at Risk for Falls Due to  No Fall Risks No Fall Risks No Fall Risks No Fall Risks  Fall risk Follow up  Falls evaluation completed Falls evaluation completed Falls evaluation completed Falls evaluation completed             Social Hx   Social History   Socioeconomic History   Marital status: Single    Spouse name: Not on file   Number of children: 1   Years of education: Not on file   Highest education level: Bachelor's degree (e.g., BA, AB, BS)  Occupational History   Not on file  Tobacco Use   Smoking status: Never   Smokeless tobacco: Never  Vaping Use   Vaping Use: Never used  Substance and Sexual Activity   Alcohol use: Yes    Comment: 1 drink 1-2 times weekly   Drug use: Never   Sexual activity: Not on file  Other  Topics Concern   Not on file  Social History Narrative   Not on file   Social Determinants of Health   Financial Resource Strain: Low Risk  (01/04/2023)   Overall Financial Resource Strain (CARDIA)    Difficulty of Paying Living Expenses: Not very hard  Food Insecurity: No Food Insecurity (01/04/2023)   Hunger Vital Sign    Worried About Running Out of Food in the Last Year: Never true    Ran Out of Food in the Last Year: Never true  Transportation Needs: No Transportation Needs (01/04/2023)   PRAPARE - Administrator, Civil Service (Medical): No    Lack of Transportation (Non-Medical): No  Physical Activity: Unknown (01/04/2023)   Exercise Vital Sign    Days of Exercise per Week: 0 days    Minutes of Exercise per Session: Not on file  Stress: Stress Concern Present (01/04/2023)   [employer] of Occupational Health - Occupational Stress Questionnaire    Feeling of Stress : To some extent  Social  Connections: Socially Isolated (01/04/2023)   Social Connection and Isolation Panel [NHANES]    Frequency of Communication with Friends and Family: Once a week    Frequency of Social Gatherings with Friends and Family: Once a week    Attends Religious Services: Never    Database administrator or Organizations: No    Attends Engineer, structural: Not on file    Marital Status: Never married   Past Medical History:  Diagnosis Date   Arthritis    Depression    GERD (gastroesophageal reflux disease)    Hypertension    OSA (obstructive sleep apnea)    Past Surgical History:  Procedure Laterality Date   BACK SURGERY  2012   CARDIAC CATHETERIZATION     negative study no CAD   CESAREAN SECTION     FLEXOR TENOTOMY  Left 12/19/2021   Procedure: LEFT BRACHIORADIALIS RELEASE;  Surgeon: Betha Loa, MD;  Location: [facility];  Service: Orthopedics;  Laterality: Left;  Block   HERNIA REPAIR     LAPAROSCOPIC GASTRIC SLEEVE RESECTION     OPEN REDUCTION INTERNAL FIXATION (ORIF) DISTAL RADIAL FRACTURE Left 12/19/2021   Procedure: OPEN REDUCTION INTERNAL FIXATION (ORIF) LEFT DISTAL RADIUS FRACTURE;  Surgeon: Betha Loa, MD;  Location: [facility];  Service: Orthopedics;  Laterality: Left;  Block    Family History  Problem Relation Age of Onset   Arthritis Mother    Hypertension Mother    Cancer Mother        VULVAR CANCER   Diabetes Mother    Hyperlipidemia Mother    Hypothyroidism Mother    Arthritis Father    Hypertension Father    Hyperlipidemia Father    Breast cancer Maternal Aunt    Heart attack Paternal Grandfather     ROS CONSTITUTIONAL: Negative for chills, fatigue, fever, unintentional weight gain and unintentional weight loss.  E/N/T: Negative for ear pain, nasal congestion and sore throat.  CARDIOVASCULAR: Negative for chest pain, dizziness, palpitations and pedal edema.  RESPIRATORY: Negative for recent cough and dyspnea.   GASTROINTESTINAL: Negative for abdominal pain, acid reflux symptoms, constipation, diarrhea, nausea and vomiting.  MSK: Negative for arthralgias and myalgias.  INTEGUMENTARY: Negative for rash.  NEUROLOGICAL: Negative for dizziness and headaches.  PSYCHIATRIC: Negative for sleep disturbance and to question depression screen.  Negative for depression, negative for anhedonia.   Objective:  PHYSICAL EXAM:   BP 130/84 (BP Location: Left Arm, Patient  Position: Sitting, Cuff Size: Large)   Pulse 99   Temp (!) 97.5 F (36.4 C) (Temporal)   Ht 5\' 2"  (1.575 m)   Wt 269 lb (122 kg)   SpO2 96%   BMI 49.20 kg/m    GEN: Well nourished, well developed, in no acute distress  HEENT: normal external ears and nose - normal external auditory canals and TMS - hearing grossly normal - normal nasal mucosa and septum - Lips, Teeth and Gums - normal  Oropharynx - normal mucosa, palate, and posterior pharynx Neck: no JVD or masses - no thyromegaly Cardiac: RRR; no murmurs, rubs, or gallops,no edema - no significant varicosities Respiratory:  normal respiratory rate and pattern with no distress - normal breath sounds with no rales, rhonchi, wheezes or rubs Breasts - no masses GI: normal bowel sounds, no masses or tenderness GU - normal external genitalia - cervix normal - bimanual exam normal MS: no deformity or atrophy  Skin: warm and dry, no rash  Neuro:  Alert and Oriented x 3, Strength and sensation are intact - CN II-Xii grossly intact Psych: euthymic mood, appropriate affect and demeanor  Assessment & Plan:  Annual physical exam -     IGP, Aptima HPV, rfx 16/18,45    This is a list of the screening recommended for you and due dates:  Health Maintenance  Topic Date Due   Pap Smear  Never done   Colon Cancer Screening  Never done   Flu Shot  03/05/2023   Mammogram  07/15/2023   DTaP/Tdap/Td vaccine (3 - Td or Tdap) 12/08/2031   HPV Vaccine  Aged Out   COVID-19 Vaccine  Discontinued    Hepatitis C Screening  Discontinued   HIV Screening  Discontinued     Follow-up: Return in about 4 months (around 05/10/2023) for chronic fasting follow-up.  An After Visit Summary was printed and given to the patient.  Jettie Pagan [facility] [phone]

## 2023-01-13 LAB — IGP, APTIMA HPV, RFX 16/18,45
HPV Aptima: NEGATIVE
PAP Smear Comment: 0

## 2023-01-14 ENCOUNTER — Encounter: Payer: Self-pay | Admitting: Physician Assistant

## 2023-01-14 ENCOUNTER — Ambulatory Visit (INDEPENDENT_AMBULATORY_CARE_PROVIDER_SITE_OTHER): Payer: No Typology Code available for payment source | Admitting: Physician Assistant

## 2023-01-14 VITALS — BP 128/76 | HR 107 | Temp 97.2°F | Ht 62.0 in | Wt 264.2 lb

## 2023-01-14 DIAGNOSIS — E782 Mixed hyperlipidemia: Secondary | ICD-10-CM

## 2023-01-14 DIAGNOSIS — J309 Allergic rhinitis, unspecified: Secondary | ICD-10-CM | POA: Insufficient documentation

## 2023-01-14 DIAGNOSIS — I10 Essential (primary) hypertension: Secondary | ICD-10-CM

## 2023-01-14 DIAGNOSIS — R000 Tachycardia, unspecified: Secondary | ICD-10-CM | POA: Diagnosis not present

## 2023-01-14 DIAGNOSIS — E559 Vitamin D deficiency, unspecified: Secondary | ICD-10-CM

## 2023-01-14 DIAGNOSIS — J3089 Other allergic rhinitis: Secondary | ICD-10-CM

## 2023-01-14 MED ORDER — MONTELUKAST SODIUM 10 MG PO TABS
10.0000 mg | ORAL_TABLET | Freq: Every day | ORAL | 3 refills | Status: DC
Start: 2023-01-14 — End: 2023-05-12

## 2023-01-14 NOTE — Progress Notes (Signed)
Subjective:  Patient ID: Natalie Hoffman, female    DOB: 1976-07-19  Age: 47 y.o. MRN: [id number]  Chief Complaint  Patient presents with   Medical Management of Chronic Issues    Hypertension  Depression         Pt presents for follow up of hypertension.  The patient is tolerating the medication well without side effects. Compliance with treatment has been good; including taking medication as directed  , and following up as directed. Currently on losartan 100mg  qd  Pt with history of GERD - currently on nexium 40mg  qd and stable   Mixed hyperlipidemia  Pt presents with hyperlipidemia.  Compliance with treatment has been good The patient is compliant with medications, maintains a low cholesterol diet , follows up as directed , The patient denies experiencing any hypercholesterolemia related symptoms. Currently on crestor 5 mg qd  Pt with history of vit D def - stable on current supplement and due for labwork  Pt with history of restless leg syndrome - currently on mirapex 0.5mg  -  Pt with history of depression/anxiety - she has tried multiple medications in the past and recently placed on prozac 20mg  qd - states she seems to be doing well on this so far  Pt is following with [facility] weight management clinic for obesity.  She had a gastric sleeve 12/22 but continues to struggle with weight - currently taking wegovy -.5mg  weekly   Current Outpatient Medications on File Prior to Visit  Medication Sig Dispense Refill   clotrimazole-betamethasone (LOTRISONE) cream Apply 1 Application topically daily. 30 g 2   esomeprazole (NEXIUM) 40 MG capsule TAKE 1 CAPSULE (40 MG TOTAL) BY MOUTH DAILY AT 12 NOON. 90 capsule 0   ferrous sulfate 325 (65 FE) MG EC tablet Take 325 mg by mouth daily with breakfast.     fluocinonide cream (LIDEX) 0.05 % Apply 1 Application topically 2 (two) times daily. 30 g 2   FLUoxetine (PROZAC) 20 MG capsule Take 1 capsule (20 mg total) by mouth daily.  30 capsule 2   fluticasone (FLONASE) 50 MCG/ACT nasal spray Place 2 sprays into both nostrils daily. 16 g 6   levocetirizine (XYZAL) 5 MG tablet TAKE 1 TABLET BY MOUTH EVERY DAY IN THE EVENING 90 tablet 1   losartan (COZAAR) 100 MG tablet TAKE 1 TABLET BY MOUTH EVERY DAY 90 tablet 0   Multiple Vitamins-Minerals (BARIATRIC MULTIVITAMINS/IRON) CAPS Take by mouth.     pramipexole (MIRAPEX) 0.5 MG tablet TAKE 1 TABLET BY MOUTH 3 TIMES DAILY. 270 tablet 0   rosuvastatin (CRESTOR) 5 MG tablet TAKE 1 TABLET (5 MG TOTAL) BY MOUTH DAILY. 90 tablet 0   Vitamin D, Ergocalciferol, (DRISDOL) 1.25 MG (50000 UNIT) CAPS capsule TAKE 1 CAPSULE BY MOUTH ONE TIME PER WEEK 12 capsule 1   WEGOVY 0.5 MG/0.5ML SOAJ Inject into the skin.     No current facility-administered medications on file prior to visit.   Past Medical History:  Diagnosis Date   Arthritis    Depression    GERD (gastroesophageal reflux disease)    Hypertension    OSA (obstructive sleep apnea)    Past Surgical History:  Procedure Laterality Date   BACK SURGERY  2012   CARDIAC CATHETERIZATION     negative study no CAD   CESAREAN SECTION     FLEXOR TENOTOMY  Left 12/19/2021   Procedure: LEFT BRACHIORADIALIS RELEASE;  Surgeon: Betha Loa, MD;  Location: [facility];  Service: Orthopedics;  Laterality: Left;  Block   HERNIA REPAIR     LAPAROSCOPIC GASTRIC SLEEVE RESECTION     OPEN REDUCTION INTERNAL FIXATION (ORIF) DISTAL RADIAL FRACTURE Left 12/19/2021   Procedure: OPEN REDUCTION INTERNAL FIXATION (ORIF) LEFT DISTAL RADIUS FRACTURE;  Surgeon: Betha Loa, MD;  Location: [facility];  Service: Orthopedics;  Laterality: Left;  Block    Family History  Problem Relation Age of Onset   Arthritis Mother    Hypertension Mother    Cancer Mother        VULVAR CANCER   Diabetes Mother    Hyperlipidemia Mother    Hypothyroidism Mother    Arthritis Father    Hypertension Father    Hyperlipidemia Father    Breast  cancer Maternal Aunt    Heart attack Paternal Grandfather    Social History   Socioeconomic History   Marital status: Single    Spouse name: Not on file   Number of children: 1   Years of education: Not on file   Highest education level: Bachelor's degree (e.g., BA, AB, BS)  Occupational History   Not on file  Tobacco Use   Smoking status: Never   Smokeless tobacco: Never  Vaping Use   Vaping Use: Never used  Substance and Sexual Activity   Alcohol use: Yes    Comment: 1 drink 1-2 times weekly   Drug use: Never   Sexual activity: Not on file  Other Topics Concern   Not on file  Social History Narrative   Not on file   Social Determinants of Health   Financial Resource Strain: Low Risk  (01/04/2023)   Overall Financial Resource Strain (CARDIA)    Difficulty of Paying Living Expenses: Not very hard  Food Insecurity: No Food Insecurity (01/04/2023)   Hunger Vital Sign    Worried About Running Out of Food in the Last Year: Never true    Ran Out of Food in the Last Year: Never true  Transportation Needs: No Transportation Needs (01/04/2023)   PRAPARE - Administrator, Civil Service (Medical): No    Lack of Transportation (Non-Medical): No  Physical Activity: Unknown (01/04/2023)   Exercise Vital Sign    Days of Exercise per Week: 0 days    Minutes of Exercise per Session: Not on file  Stress: Stress Concern Present (01/04/2023)   [employer] of Occupational Health - Occupational Stress Questionnaire    Feeling of Stress : To some extent  Social Connections: Socially Isolated (01/04/2023)   Social Connection and Isolation Panel [NHANES]    Frequency of Communication with Friends and Family: Once a week    Frequency of Social Gatherings with Friends and Family: Once a week    Attends Religious Services: Never    Database administrator or Organizations: No    Attends Engineer, structural: Not on file    Marital Status: Never married   CONSTITUTIONAL:  Negative for chills, fatigue, fever, unintentional weight gain and unintentional weight loss.  E/N/T: Negative for ear pain, nasal congestion and sore throat.  CARDIOVASCULAR: Negative for chest pain, dizziness, palpitations and pedal edema.  RESPIRATORY: Negative for recent cough and dyspnea.  GASTROINTESTINAL: Negative for abdominal pain, acid reflux symptoms, constipation, diarrhea, nausea and vomiting.  MSK: Negative for arthralgias and myalgias.  INTEGUMENTARY: Negative for rash.  NEUROLOGICAL: Negative for dizziness and headaches.  PSYCHIATRIC: Negative for sleep disturbance and to question depression screen.  Negative for depression, negative for anhedonia.  Objective:  PHYSICAL EXAM:   VS: BP 128/76 (BP Location: Left Arm, Patient Position: Sitting, Cuff Size: Large)   Pulse (!) 107   Temp (!) 97.2 F (36.2 C) (Temporal)   Ht 5\' 2"  (1.575 m)   Wt 264 lb 3.2 oz (119.8 kg)   SpO2 98%   BMI 48.32 kg/m   GEN: Well nourished, well developed, in no acute distress  Cardiac: slightly tacycardic - regular rhythm; no murmurs, rubs, or gallops,no edema - no significant varicosities Respiratory:  normal respiratory rate and pattern with no distress - normal breath sounds with no rales, rhonchi, wheezes or rubs Skin: warm and dry, no rash  Psych: euthymic mood, appropriate affect and demeanor  EKG - sinus tachycardia Lab Results  Component Value Date   WBC 6.8 10/13/2022   HGB 14.4 10/13/2022   HCT 43.2 10/13/2022   PLT 346 10/13/2022   GLUCOSE 89 10/13/2022   CHOL 187 10/13/2022   TRIG 221 (H) 10/13/2022   HDL 52 10/13/2022   LDLCALC 98 10/13/2022   ALT 37 (H) 10/13/2022   AST 27 10/13/2022   NA 143 10/13/2022   K 4.3 10/13/2022   CL 102 10/13/2022   CREATININE 0.69 10/13/2022   BUN 12 10/13/2022   CO2 26 10/13/2022   TSH 1.910 10/13/2022      Assessment & Plan:   Problem List Items Addressed This Visit       Cardiovascular and Mediastinum   Essential  hypertension   Relevant Orders   CBC with Differential/Platelet   Comprehensive metabolic panel Continue meds             Other   Depression, recurrent (HCC)   Relevant Orders   Continue prozac   Mixed hyperlipidemia - Primary   Relevant Orders   Lipid panel Watch diet Continue crestor 5mg    Other Visit Diagnoses     Vitamin D deficiency       Relevant Medications   Vitamin D, Ergocalciferol, (DRISDOL) 1.25 MG (50000 UNIT) CAPS capsule   Other Relevant Orders   VITAMIN D 25 Hydroxy (Vit-D Deficiency, Fractures)   Gastroesophageal reflux disease without esophagitis       Relevant Medications   esomeprazole (NEXIUM) 40 MG capsule      Tachycardia Labwork pending Stop any caffeine/decongestants Follow up if becomes symptomatic   Allergic rhinitis Rx singulair        .  Meds ordered this encounter  Medications   montelukast (SINGULAIR) 10 MG tablet    Sig: Take 1 tablet (10 mg total) by mouth at bedtime.    Dispense:  30 tablet    Refill:  3    Order Specific Question:   Supervising Provider    AnswerCorey Harold    Orders Placed This Encounter  Procedures   CBC with Differential/Platelet   Comprehensive metabolic panel   Lipid panel   VITAMIN D 25 Hydroxy (Vit-D Deficiency, Fractures)   T4, free   TSH   EKG 12-Lead     Follow-up: No follow-ups on file.  An After Visit Summary was printed and given to the patient.  Jettie Pagan [facility] [phone]

## 2023-01-15 ENCOUNTER — Encounter: Payer: Self-pay | Admitting: Specialist

## 2023-01-15 LAB — CBC WITH DIFFERENTIAL/PLATELET
Basophils Absolute: 0 10*3/uL (ref 0.0–0.2)
Basos: 0 %
EOS (ABSOLUTE): 0 10*3/uL (ref 0.0–0.4)
Eos: 0 %
Hematocrit: 45 % (ref 34.0–46.6)
Hemoglobin: 15.2 g/dL (ref 11.1–15.9)
Immature Grans (Abs): 0 10*3/uL (ref 0.0–0.1)
Immature Granulocytes: 0 %
Lymphocytes Absolute: 3 10*3/uL (ref 0.7–3.1)
Lymphs: 40 %
MCH: 31.6 pg (ref 26.6–33.0)
MCHC: 33.8 g/dL (ref 31.5–35.7)
MCV: 94 fL (ref 79–97)
Monocytes Absolute: 0.5 10*3/uL (ref 0.1–0.9)
Monocytes: 7 %
Neutrophils Absolute: 4.1 10*3/uL (ref 1.4–7.0)
Neutrophils: 53 %
Platelets: 398 10*3/uL (ref 150–450)
RBC: 4.81 x10E6/uL (ref 3.77–5.28)
RDW: 13.2 % (ref 11.7–15.4)
WBC: 7.7 10*3/uL (ref 3.4–10.8)

## 2023-01-15 LAB — COMPREHENSIVE METABOLIC PANEL
ALT: 59 IU/L — ABNORMAL HIGH (ref 0–32)
AST: 34 IU/L (ref 0–40)
Albumin/Globulin Ratio: 1.8
Albumin: 4.6 g/dL (ref 3.9–4.9)
Alkaline Phosphatase: 95 IU/L (ref 44–121)
BUN/Creatinine Ratio: 22 (ref 9–23)
BUN: 16 mg/dL (ref 6–24)
Bilirubin Total: 0.4 mg/dL (ref 0.0–1.2)
CO2: 22 mmol/L (ref 20–29)
Calcium: 10 mg/dL (ref 8.7–10.2)
Chloride: 104 mmol/L (ref 96–106)
Creatinine, Ser: 0.73 mg/dL (ref 0.57–1.00)
Globulin, Total: 2.6 g/dL (ref 1.5–4.5)
Glucose: 90 mg/dL (ref 70–99)
Potassium: 4.6 mmol/L (ref 3.5–5.2)
Sodium: 141 mmol/L (ref 134–144)
Total Protein: 7.2 g/dL (ref 6.0–8.5)
eGFR: 103 mL/min/{1.73_m2} (ref >59)

## 2023-01-15 LAB — TSH: TSH: 2.12 u[IU]/mL (ref 0.450–4.500)

## 2023-01-15 LAB — VITAMIN D 25 HYDROXY (VIT D DEFICIENCY, FRACTURES): Vit D, 25-Hydroxy: 57.6 ng/mL (ref 30.0–100.0)

## 2023-01-15 LAB — LIPID PANEL
Chol/HDL Ratio: 3.5 ratio (ref 0.0–4.4)
Cholesterol, Total: 199 mg/dL (ref 100–199)
HDL: 57 mg/dL (ref >39)
LDL Chol Calc (NIH): 107 mg/dL — ABNORMAL HIGH (ref 0–99)
Triglycerides: 207 mg/dL — ABNORMAL HIGH (ref 0–149)
VLDL Cholesterol Cal: 35 mg/dL (ref 5–40)

## 2023-01-15 LAB — T4, FREE: Free T4: 1.07 ng/dL (ref 0.82–1.77)

## 2023-01-26 ENCOUNTER — Other Ambulatory Visit: Payer: Self-pay

## 2023-01-26 DIAGNOSIS — I10 Essential (primary) hypertension: Secondary | ICD-10-CM

## 2023-01-27 MED ORDER — LOSARTAN POTASSIUM 100 MG PO TABS
100.0000 mg | ORAL_TABLET | Freq: Every day | ORAL | 0 refills | Status: DC
Start: 2023-01-27 — End: 2023-04-14

## 2023-03-30 ENCOUNTER — Other Ambulatory Visit: Payer: Self-pay

## 2023-03-30 DIAGNOSIS — J060 Acute laryngopharyngitis: Secondary | ICD-10-CM

## 2023-03-30 DIAGNOSIS — J029 Acute pharyngitis, unspecified: Secondary | ICD-10-CM

## 2023-03-30 MED ORDER — LEVOCETIRIZINE DIHYDROCHLORIDE 5 MG PO TABS
5.0000 mg | ORAL_TABLET | Freq: Every evening | ORAL | 1 refills | Status: DC
Start: 2023-03-30 — End: 2023-05-12

## 2023-04-06 ENCOUNTER — Other Ambulatory Visit: Payer: Self-pay | Admitting: Physician Assistant

## 2023-04-06 DIAGNOSIS — K219 Gastro-esophageal reflux disease without esophagitis: Secondary | ICD-10-CM

## 2023-04-13 ENCOUNTER — Other Ambulatory Visit: Payer: Self-pay | Admitting: Physician Assistant

## 2023-04-14 ENCOUNTER — Other Ambulatory Visit: Payer: Self-pay

## 2023-04-14 DIAGNOSIS — I10 Essential (primary) hypertension: Secondary | ICD-10-CM

## 2023-04-14 MED ORDER — LOSARTAN POTASSIUM 100 MG PO TABS: 100.0000 mg | ORAL_TABLET | Freq: Every day | ORAL | 0 refills | Status: DC

## 2023-04-22 ENCOUNTER — Telehealth: Payer: Self-pay

## 2023-04-22 ENCOUNTER — Other Ambulatory Visit: Payer: Self-pay | Admitting: Physician Assistant

## 2023-04-22 MED ORDER — FLUOXETINE HCL 20 MG PO CAPS: 20.0000 mg | ORAL_CAPSULE | Freq: Every day | ORAL | 0 refills | Status: DC

## 2023-04-22 NOTE — Telephone Encounter (Signed)
Yes will send in refill - to what pharmacy

## 2023-04-22 NOTE — Telephone Encounter (Signed)
Which pharmacy?

## 2023-04-22 NOTE — Telephone Encounter (Signed)
Patient called and stated that she was seeing psychologist and they started her on Prozac 20 mg, she can no longer afford to see them anymore and wanted to know can provider refill her Prozac. Please advise

## 2023-04-24 ENCOUNTER — Other Ambulatory Visit: Payer: Self-pay

## 2023-04-24 MED ORDER — FLUOXETINE HCL 20 MG PO CAPS: 20.0000 mg | ORAL_CAPSULE | Freq: Every day | ORAL | 0 refills | Status: DC

## 2023-04-25 ENCOUNTER — Other Ambulatory Visit: Payer: Self-pay | Admitting: Physician Assistant

## 2023-04-25 DIAGNOSIS — I10 Essential (primary) hypertension: Secondary | ICD-10-CM

## 2023-04-28 ENCOUNTER — Other Ambulatory Visit: Payer: Self-pay

## 2023-04-28 MED ORDER — FLUOXETINE HCL 20 MG PO CAPS: 20.0000 mg | ORAL_CAPSULE | Freq: Every day | ORAL | 0 refills | Status: DC

## 2023-04-28 NOTE — Telephone Encounter (Signed)
Has to be sent I= for 90 days for insurance to cover it.

## 2023-05-12 ENCOUNTER — Encounter: Payer: Self-pay | Admitting: Physician Assistant

## 2023-05-12 ENCOUNTER — Ambulatory Visit (INDEPENDENT_AMBULATORY_CARE_PROVIDER_SITE_OTHER): Payer: No Typology Code available for payment source | Admitting: Physician Assistant

## 2023-05-12 VITALS — BP 110/80 | HR 95 | Temp 97.4°F | Ht 62.0 in | Wt 260.0 lb

## 2023-05-12 DIAGNOSIS — Z6841 Body Mass Index (BMI) 40.0 and over, adult: Secondary | ICD-10-CM | POA: Insufficient documentation

## 2023-05-12 DIAGNOSIS — K219 Gastro-esophageal reflux disease without esophagitis: Secondary | ICD-10-CM | POA: Diagnosis not present

## 2023-05-12 DIAGNOSIS — Z23 Encounter for immunization: Secondary | ICD-10-CM | POA: Diagnosis not present

## 2023-05-12 DIAGNOSIS — G8929 Other chronic pain: Secondary | ICD-10-CM | POA: Insufficient documentation

## 2023-05-12 DIAGNOSIS — F339 Major depressive disorder, recurrent, unspecified: Secondary | ICD-10-CM | POA: Insufficient documentation

## 2023-05-12 DIAGNOSIS — E782 Mixed hyperlipidemia: Secondary | ICD-10-CM

## 2023-05-12 DIAGNOSIS — Z1231 Encounter for screening mammogram for malignant neoplasm of breast: Secondary | ICD-10-CM | POA: Insufficient documentation

## 2023-05-12 DIAGNOSIS — L304 Erythema intertrigo: Secondary | ICD-10-CM | POA: Diagnosis not present

## 2023-05-12 DIAGNOSIS — M5442 Lumbago with sciatica, left side: Secondary | ICD-10-CM

## 2023-05-12 DIAGNOSIS — I10 Essential (primary) hypertension: Secondary | ICD-10-CM | POA: Diagnosis not present

## 2023-05-12 DIAGNOSIS — E559 Vitamin D deficiency, unspecified: Secondary | ICD-10-CM

## 2023-05-12 DIAGNOSIS — M5441 Lumbago with sciatica, right side: Secondary | ICD-10-CM

## 2023-05-12 DIAGNOSIS — J3089 Other allergic rhinitis: Secondary | ICD-10-CM

## 2023-05-12 MED ORDER — CLOTRIMAZOLE-BETAMETHASONE 1-0.05 % EX CREA
1.0000 | TOPICAL_CREAM | Freq: Every day | CUTANEOUS | 2 refills | Status: DC
Start: 2023-05-12 — End: 2023-11-10

## 2023-05-12 MED ORDER — LEVOCETIRIZINE DIHYDROCHLORIDE 5 MG PO TABS
5.0000 mg | ORAL_TABLET | Freq: Every evening | ORAL | 1 refills | Status: DC
Start: 2023-05-12 — End: 2023-06-05

## 2023-05-12 MED ORDER — LOSARTAN POTASSIUM 100 MG PO TABS
100.0000 mg | ORAL_TABLET | Freq: Every day | ORAL | 1 refills | Status: DC
Start: 2023-05-12 — End: 2024-02-08

## 2023-05-12 MED ORDER — MONTELUKAST SODIUM 10 MG PO TABS
10.0000 mg | ORAL_TABLET | Freq: Every day | ORAL | 1 refills | Status: DC
Start: 2023-05-12 — End: 2024-02-01

## 2023-05-12 MED ORDER — FLUTICASONE PROPIONATE 50 MCG/ACT NA SUSP: 2.0000 | Freq: Every day | NASAL | 6 refills | Status: AC

## 2023-05-12 MED ORDER — FLUOXETINE HCL 20 MG PO CAPS: 20.0000 mg | ORAL_CAPSULE | Freq: Every day | ORAL | 1 refills | Status: DC

## 2023-05-12 MED ORDER — PRAMIPEXOLE DIHYDROCHLORIDE 0.5 MG PO TABS: 0.5000 mg | ORAL_TABLET | Freq: Three times a day (TID) | ORAL | 1 refills | Status: AC

## 2023-05-12 MED ORDER — FLUOCINONIDE 0.05 % EX CREA: 1.0000 | TOPICAL_CREAM | Freq: Two times a day (BID) | CUTANEOUS | 2 refills | Status: AC

## 2023-05-12 MED ORDER — TOPIRAMATE 50 MG PO TABS
50.0000 mg | ORAL_TABLET | Freq: Two times a day (BID) | ORAL | 1 refills | Status: DC
Start: 2023-05-12 — End: 2023-12-24

## 2023-05-12 MED ORDER — ESOMEPRAZOLE MAGNESIUM 40 MG PO CPDR
40.0000 mg | DELAYED_RELEASE_CAPSULE | Freq: Every day | ORAL | 1 refills | Status: DC
Start: 2023-05-12 — End: 2024-02-01

## 2023-05-12 MED ORDER — ROSUVASTATIN CALCIUM 5 MG PO TABS: 5.0000 mg | ORAL_TABLET | Freq: Every day | ORAL | 1 refills | Status: DC

## 2023-05-12 MED ORDER — VITAMIN D (ERGOCALCIFEROL) 1.25 MG (50000 UNIT) PO CAPS
50000.0000 [IU] | ORAL_CAPSULE | ORAL | 1 refills | Status: DC
Start: 2023-05-12 — End: 2023-12-21

## 2023-05-12 NOTE — Progress Notes (Signed)
Subjective:  Patient ID: Natalie Hoffman, female    DOB: Feb 28, 1976  Age: 47 y.o. MRN: [id number]  Chief Complaint  Patient presents with   Medical Management of Chronic Issues    Hypertension  Depression         Pt presents for follow up of hypertension.  The patient is tolerating the medication well without side effects. Compliance with treatment has been good; including taking medication as directed  , and following up as directed. Currently on losartan 100mg  qd  Pt with history of GERD - currently on nexium 40mg  qd and stable   Mixed hyperlipidemia  Pt presents with hyperlipidemia.  Compliance with treatment has been good The patient is compliant with medications, maintains a low cholesterol diet , follows up as directed , The patient denies experiencing any hypercholesterolemia related symptoms. Currently on crestor 5 mg qd  Pt with history of vit D def - stable on current supplement and due for labwork  Pt with history of restless leg syndrome - currently on mirapex 0.5mg  - she states that helps with restless legs symptoms but states she has had trouble with her back in the past and takes gabapentin 300mg  bid (not in her chart) for low back pain and leg pain.  She has seen ortho in the past for these issues and has had problems for several years - agrees to referral back to ortho for further evaluation of low back pain with bilateral sciatica symptoms  Pt with history of depression/anxiety - she has tried multiple medications in the past and recently placed on prozac 20mg  qd - states she seems to be doing well on this - had run out of medication for a few weeks but now restarted and it is helping  Pt is following with [facility] weight management clinic for obesity.  She had a gastric sleeve 12/22 but continues to struggle with weight - she is currently taking topamax 50mg  at bedtime and had been losing weight but now struggling again - would like to increase dose  Pt  would like to schedule mammogram Pt would like flu shot today   Current Outpatient Medications on File Prior to Visit  Medication Sig Dispense Refill   gabapentin (NEURONTIN) 300 MG capsule Take 300 mg by mouth 2 (two) times daily.     ferrous sulfate 325 (65 FE) MG EC tablet Take 325 mg by mouth daily with breakfast.     Multiple Vitamins-Minerals (BARIATRIC MULTIVITAMINS/IRON) CAPS Take by mouth.     No current facility-administered medications on file prior to visit.   Past Medical History:  Diagnosis Date   Arthritis    Depression    GERD (gastroesophageal reflux disease)    Hypertension    OSA (obstructive sleep apnea)    Past Surgical History:  Procedure Laterality Date   BACK SURGERY  2012   CARDIAC CATHETERIZATION     negative study no CAD   CESAREAN SECTION     FLEXOR TENOTOMY  Left 12/19/2021   Procedure: LEFT BRACHIORADIALIS RELEASE;  Surgeon: Betha Loa, MD;  Location: [facility];  Service: Orthopedics;  Laterality: Left;  Block   HERNIA REPAIR     LAPAROSCOPIC GASTRIC SLEEVE RESECTION     OPEN REDUCTION INTERNAL FIXATION (ORIF) DISTAL RADIAL FRACTURE Left 12/19/2021   Procedure: OPEN REDUCTION INTERNAL FIXATION (ORIF) LEFT DISTAL RADIUS FRACTURE;  Surgeon: Betha Loa, MD;  Location: [facility];  Service: Orthopedics;  Laterality: Left;  Block  Family History  Problem Relation Age of Onset   Arthritis Mother    Hypertension Mother    Cancer Mother        VULVAR CANCER   Diabetes Mother    Hyperlipidemia Mother    Hypothyroidism Mother    Arthritis Father    Hypertension Father    Hyperlipidemia Father    Breast cancer Maternal Aunt    Heart attack Paternal Grandfather    Social History   Socioeconomic History   Marital status: Single    Spouse name: Not on file   Number of children: 1   Years of education: Not on file   Highest education level: Bachelor's degree (e.g., BA, AB, BS)  Occupational History   Not on  file  Tobacco Use   Smoking status: Never   Smokeless tobacco: Never  Vaping Use   Vaping status: Never Used  Substance and Sexual Activity   Alcohol use: Yes    Comment: 1 drink 1-2 times weekly   Drug use: Never   Sexual activity: Not on file  Other Topics Concern   Not on file  Social History Narrative   Not on file   Social Determinants of Health   Financial Resource Strain: Low Risk  (01/04/2023)   Overall Financial Resource Strain (CARDIA)    Difficulty of Paying Living Expenses: Not very hard  Food Insecurity: No Food Insecurity (01/04/2023)   Hunger Vital Sign    Worried About Running Out of Food in the Last Year: Never true    Ran Out of Food in the Last Year: Never true  Transportation Needs: No Transportation Needs (01/04/2023)   PRAPARE - Administrator, Civil Service (Medical): No    Lack of Transportation (Non-Medical): No  Physical Activity: Inactive (05/12/2023)   Exercise Vital Sign    Days of Exercise per Week: 0 days    Minutes of Exercise per Session: 0 min  Stress: Stress Concern Present (01/04/2023)   [employer] of Occupational Health - Occupational Stress Questionnaire    Feeling of Stress : To some extent  Social Connections: Socially Isolated (01/04/2023)   Social Connection and Isolation Panel [NHANES]    Frequency of Communication with Friends and Family: Once a week    Frequency of Social Gatherings with Friends and Family: Once a week    Attends Religious Services: Never    Database administrator or Organizations: No    Attends Engineer, structural: Not on file    Marital Status: Never married   CONSTITUTIONAL: Negative for chills, fatigue, fever, unintentional weight gain and unintentional weight loss.  E/N/T: Negative for ear pain, nasal congestion and sore throat.  CARDIOVASCULAR: Negative for chest pain, dizziness, palpitations and pedal edema.  RESPIRATORY: Negative for recent cough and dyspnea.  GASTROINTESTINAL:  Negative for abdominal pain, acid reflux symptoms, constipation, diarrhea, nausea and vomiting.  MSK: see HPI INTEGUMENTARY: is troubled with eczema and yeast at times NEUROLOGICAL: Negative for dizziness and headaches.  PSYCHIATRIC: Negative for sleep disturbance and to question depression screen.  Negative for depression, negative for anhedonia.       Objective:  PHYSICAL EXAM:   VS: BP 110/80   Pulse 95   Temp (!) 97.4 F (36.3 C)   Ht 5\' 2"  (1.575 m)   Wt 260 lb (117.9 kg)   SpO2 97%   BMI 47.55 kg/m   GEN: Well nourished, well developed, in no acute distress - obese  Cardiac: RRR; no murmurs,  rubs, or gallops,no edema -  Respiratory:  normal respiratory rate and pattern with no distress - normal breath sounds with no rales, rhonchi, wheezes or rubs  MS: no deformity or atrophy  Skin: warm and dry, no rash  Neuro:  Alert and Oriented x 3, - CN II-Xii grossly intact Psych: euthymic mood, appropriate affect and demeanor  Lab Results  Component Value Date   WBC 7.7 01/14/2023   HGB 15.2 01/14/2023   HCT 45.0 01/14/2023   PLT 398 01/14/2023   GLUCOSE 90 01/14/2023   CHOL 199 01/14/2023   TRIG 207 (H) 01/14/2023   HDL 57 01/14/2023   LDLCALC 107 (H) 01/14/2023   ALT 59 (H) 01/14/2023   AST 34 01/14/2023   NA 141 01/14/2023   K 4.6 01/14/2023   CL 104 01/14/2023   CREATININE 0.73 01/14/2023   BUN 16 01/14/2023   CO2 22 01/14/2023   TSH 2.120 01/14/2023      Assessment & Plan:   Problem List Items Addressed This Visit       Cardiovascular and Mediastinum   Essential hypertension   Relevant Orders   CBC with Differential/Platelet   Comprehensive metabolic panel Continue meds             Other   Depression, recurrent (HCC)   Relevant Orders   Continue prozac   Mixed hyperlipidemia - Primary   Relevant Orders   Lipid panel Watch diet Continue crestor 5mg    Other Visit Diagnoses     Vitamin D deficiency       Relevant Medications   Vitamin  D, Ergocalciferol, (DRISDOL) 1.25 MG (50000 UNIT) CAPS capsule   Other Relevant Orders   VITAMIN D 25 Hydroxy (Vit-D Deficiency, Fractures)   Gastroesophageal reflux disease without esophagitis       Relevant Medications   esomeprazole (NEXIUM) 40 MG capsule         Allergic rhinitis Rx singulair Continue flonase and xyzal  Chronic low back pain with sciatica Continue gabapentin Referral to orthopedics  Need flu shot Flulaval given  Breast cancer screening Mammogram scheduled        .  Meds ordered this encounter  Medications   esomeprazole (NEXIUM) 40 MG capsule    Sig: Take 1 capsule (40 mg total) by mouth daily at 12 noon.    Dispense:  90 capsule    Refill:  1   FLUoxetine (PROZAC) 20 MG capsule    Sig: Take 1 capsule (20 mg total) by mouth daily.    Dispense:  90 capsule    Refill:  1   fluticasone (FLONASE) 50 MCG/ACT nasal spray    Sig: Place 2 sprays into both nostrils daily.    Dispense:  16 g    Refill:  6   levocetirizine (XYZAL) 5 MG tablet    Sig: Take 1 tablet (5 mg total) by mouth every evening.    Dispense:  90 tablet    Refill:  1   losartan (COZAAR) 100 MG tablet    Sig: Take 1 tablet (100 mg total) by mouth daily.    Dispense:  90 tablet    Refill:  1   montelukast (SINGULAIR) 10 MG tablet    Sig: Take 1 tablet (10 mg total) by mouth at bedtime.    Dispense:  90 tablet    Refill:  1   pramipexole (MIRAPEX) 0.5 MG tablet    Sig: Take 1 tablet (0.5 mg total) by mouth 3 (three) times daily.  Dispense:  270 tablet    Refill:  1   rosuvastatin (CRESTOR) 5 MG tablet    Sig: Take 1 tablet (5 mg total) by mouth daily.    Dispense:  90 tablet    Refill:  1   Vitamin D, Ergocalciferol, (DRISDOL) 1.25 MG (50000 UNIT) CAPS capsule    Sig: Take 1 capsule (50,000 Units total) by mouth every 7 (seven) days.    Dispense:  12 capsule    Refill:  1   clotrimazole-betamethasone (LOTRISONE) cream    Sig: Apply 1 Application topically daily.     Dispense:  30 g    Refill:  2    Order Specific Question:   Supervising Provider    Answer:   Corey Harold   fluocinonide cream (LIDEX) 0.05 %    Sig: Apply 1 Application topically 2 (two) times daily.    Dispense:  30 g    Refill:  2    Order Specific Question:   Supervising Provider    Answer:   Corey Harold   topiramate (TOPAMAX) 50 MG tablet    Sig: Take 1 tablet (50 mg total) by mouth 2 (two) times daily.    Dispense:  180 tablet    Refill:  1    Order Specific Question:   Supervising Provider    AnswerCorey Harold    Orders Placed This Encounter  Procedures   MM 3D SCREENING MAMMOGRAM BILATERAL BREAST   Flu vaccine trivalent PF, 6mos and older(Flulaval,Afluria,Fluarix,Fluzone)   CBC with Differential/Platelet   Comprehensive metabolic panel   TSH   Lipid panel   VITAMIN D 25 Hydroxy (Vit-D Deficiency, Fractures)   Ambulatory referral to Orthopedic Surgery     Follow-up: Return in about 6 months (around 11/10/2023) for chronic fasting follow-up.  An After Visit Summary was printed and given to the patient.  Jettie Pagan [facility] [phone]

## 2023-05-13 LAB — LIPID PANEL
Chol/HDL Ratio: 3.8 {ratio} (ref 0.0–4.4)
Cholesterol, Total: 197 mg/dL (ref 100–199)
HDL: 52 mg/dL (ref >39)
LDL Chol Calc (NIH): 107 mg/dL — ABNORMAL HIGH (ref 0–99)
Triglycerides: 223 mg/dL — ABNORMAL HIGH (ref 0–149)
VLDL Cholesterol Cal: 38 mg/dL (ref 5–40)

## 2023-05-13 LAB — CBC WITH DIFFERENTIAL/PLATELET
Basophils Absolute: 0.1 10*3/uL (ref 0.0–0.2)
Basos: 1 %
EOS (ABSOLUTE): 0.1 10*3/uL (ref 0.0–0.4)
Eos: 1 %
Hematocrit: 42.1 % (ref 34.0–46.6)
Hemoglobin: 14.1 g/dL (ref 11.1–15.9)
Immature Grans (Abs): 0 10*3/uL (ref 0.0–0.1)
Immature Granulocytes: 0 %
Lymphocytes Absolute: 2.8 10*3/uL (ref 0.7–3.1)
Lymphs: 32 %
MCH: 31.4 pg (ref 26.6–33.0)
MCHC: 33.5 g/dL (ref 31.5–35.7)
MCV: 94 fL (ref 79–97)
Monocytes Absolute: 0.5 10*3/uL (ref 0.1–0.9)
Monocytes: 6 %
Neutrophils Absolute: 5.1 10*3/uL (ref 1.4–7.0)
Neutrophils: 60 %
Platelets: 355 10*3/uL (ref 150–450)
RBC: 4.49 x10E6/uL (ref 3.77–5.28)
RDW: 12.6 % (ref 11.7–15.4)
WBC: 8.7 10*3/uL (ref 3.4–10.8)

## 2023-05-13 LAB — COMPREHENSIVE METABOLIC PANEL
ALT: 40 [IU]/L — ABNORMAL HIGH (ref 0–32)
AST: 30 [IU]/L (ref 0–40)
Albumin: 4.4 g/dL (ref 3.9–4.9)
Alkaline Phosphatase: 98 [IU]/L (ref 44–121)
BUN/Creatinine Ratio: 20 (ref 9–23)
BUN: 15 mg/dL (ref 6–24)
Bilirubin Total: 0.3 mg/dL (ref 0.0–1.2)
CO2: 22 mmol/L (ref 20–29)
Calcium: 9.6 mg/dL (ref 8.7–10.2)
Chloride: 104 mmol/L (ref 96–106)
Creatinine, Ser: 0.74 mg/dL (ref 0.57–1.00)
Globulin, Total: 2.3 g/dL (ref 1.5–4.5)
Glucose: 84 mg/dL (ref 70–99)
Potassium: 4.4 mmol/L (ref 3.5–5.2)
Sodium: 142 mmol/L (ref 134–144)
Total Protein: 6.7 g/dL (ref 6.0–8.5)
eGFR: 101 mL/min/{1.73_m2} (ref >59)

## 2023-05-13 LAB — TSH: TSH: 1.73 u[IU]/mL (ref 0.450–4.500)

## 2023-05-13 LAB — VITAMIN D 25 HYDROXY (VIT D DEFICIENCY, FRACTURES): Vit D, 25-Hydroxy: 58 ng/mL (ref 30.0–100.0)

## 2023-06-05 ENCOUNTER — Other Ambulatory Visit: Payer: Self-pay | Admitting: Physician Assistant

## 2023-06-05 DIAGNOSIS — J3089 Other allergic rhinitis: Secondary | ICD-10-CM

## 2023-07-15 ENCOUNTER — Ambulatory Visit
Admission: RE | Admit: 2023-07-15 | Discharge: 2023-07-15 | Disposition: A | Discharge status: Home / Self Care | Payer: No Typology Code available for payment source | Source: Ambulatory Visit | Attending: Physician Assistant | Admitting: Physician Assistant

## 2023-07-15 DIAGNOSIS — Z1231 Encounter for screening mammogram for malignant neoplasm of breast: Secondary | ICD-10-CM

## 2023-11-10 ENCOUNTER — Encounter: Payer: Self-pay | Admitting: Physician Assistant

## 2023-11-10 ENCOUNTER — Ambulatory Visit (INDEPENDENT_AMBULATORY_CARE_PROVIDER_SITE_OTHER): Payer: No Typology Code available for payment source | Admitting: Physician Assistant

## 2023-11-10 VITALS — BP 110/80 | HR 80 | Temp 98.0°F | Resp 18 | Ht 62.0 in | Wt 269.4 lb

## 2023-11-10 DIAGNOSIS — R5383 Other fatigue: Secondary | ICD-10-CM

## 2023-11-10 DIAGNOSIS — I10 Essential (primary) hypertension: Secondary | ICD-10-CM

## 2023-11-10 DIAGNOSIS — E782 Mixed hyperlipidemia: Secondary | ICD-10-CM

## 2023-11-10 DIAGNOSIS — L304 Erythema intertrigo: Secondary | ICD-10-CM

## 2023-11-10 DIAGNOSIS — K219 Gastro-esophageal reflux disease without esophagitis: Secondary | ICD-10-CM | POA: Diagnosis not present

## 2023-11-10 DIAGNOSIS — E559 Vitamin D deficiency, unspecified: Secondary | ICD-10-CM | POA: Diagnosis not present

## 2023-11-10 DIAGNOSIS — F339 Major depressive disorder, recurrent, unspecified: Secondary | ICD-10-CM

## 2023-11-10 DIAGNOSIS — K130 Diseases of lips: Secondary | ICD-10-CM

## 2023-11-10 MED ORDER — CLOTRIMAZOLE-BETAMETHASONE 1-0.05 % EX CREA
TOPICAL_CREAM | CUTANEOUS | 2 refills | Status: AC
Start: 2023-11-10 — End: ?

## 2023-11-10 MED ORDER — KETOCONAZOLE 2 % EX CREA: TOPICAL_CREAM | CUTANEOUS | 1 refills | Status: AC

## 2023-11-10 NOTE — Progress Notes (Signed)
 Subjective:  Patient ID: Natalie Hoffman, female    DOB: [birth year]  Age: 48 y.o. MRN: [id number]  Chief Complaint  Patient presents with   Medical Management of Chronic Issues    Hypertension  Depression         Pt presents for follow up of hypertension.  The patient is tolerating the medication well without side effects. Compliance with treatment has been good; including taking medication as directed  , and following up as directed. Currently on losartan 100mg  qd  Pt with history of GERD - currently on nexium 40mg  qd and stable   Mixed hyperlipidemia  Pt presents with hyperlipidemia.  Compliance with treatment has been good The patient is compliant with medications, maintains a low cholesterol diet , follows up as directed , The patient denies experiencing any hypercholesterolemia related symptoms. Currently on crestor 5 mg qd  Pt with history of vit D def - stable on current supplement and due for labwork  Pt with history of restless leg syndrome - currently on mirapex 0.5mg  - she states that helps with restless legs symptoms but states she has had trouble with her back in the past and takes gabapentin 300mg  bid  for low back pain and leg pain.  She has seen ortho in the past for these issues and has had problems for several years - she is also taking magnesium also  Pt with history of depression/anxiety - she has tried multiple medications in the past and has been placed on prozac 20mg  qd - states she seems to be doing well on this - voices no breakthrough issues at this time  Pt is following with [facility] weight management clinic for obesity.  She had a gastric sleeve 12/22 but continues to struggle with weight - she is currently taking topamax 50mg  at bedtime and had been losing weight but now struggling again - is continuing topamax  Pt on flonase , singulair and xyzal for allergies  Pt requests refill of nizoral for angular chelitis and refill of lotrisone for  intertrigo  Pt has had moderate fatigue.  She takes otc iron supplements because she states she has had low iron in the past She would like B12 levels checked as well   Current Outpatient Medications on File Prior to Visit  Medication Sig Dispense Refill   clotrimazole-betamethasone (LOTRISONE) cream Apply 1 Application topically daily. 30 g 2   esomeprazole (NEXIUM) 40 MG capsule Take 1 capsule (40 mg total) by mouth daily at 12 noon. 90 capsule 1   ferrous sulfate 325 (65 FE) MG EC tablet Take 325 mg by mouth daily with breakfast.     fluocinonide cream (LIDEX) 0.05 % Apply 1 Application topically 2 (two) times daily. 30 g 2   FLUoxetine (PROZAC) 20 MG capsule Take 1 capsule (20 mg total) by mouth daily. 90 capsule 1   fluticasone (FLONASE) 50 MCG/ACT nasal spray Place 2 sprays into both nostrils daily. 16 g 6   gabapentin (NEURONTIN) 300 MG capsule Take 300 mg by mouth 2 (two) times daily.     Inulin (FIBER CHOICE PO) Take by mouth.     levocetirizine (XYZAL) 5 MG tablet TAKE 1 TABLET BY MOUTH EVERY DAY IN THE EVENING 90 tablet 1   losartan (COZAAR) 100 MG tablet Take 1 tablet (100 mg total) by mouth daily. 90 tablet 1   Magnesium Oxide (MAG-OX PO) Take by mouth.     montelukast (SINGULAIR) 10 MG tablet Take 1 tablet (10 mg  total) by mouth at bedtime. 90 tablet 1   Multiple Vitamins-Minerals (BARIATRIC MULTIVITAMINS/IRON) CAPS Take by mouth.     Omega-3 Fatty Acids (FISH OIL PO) Take by mouth.     pramipexole (MIRAPEX) 0.5 MG tablet Take 1 tablet (0.5 mg total) by mouth 3 (three) times daily. 270 tablet 1   rosuvastatin (CRESTOR) 5 MG tablet Take 1 tablet (5 mg total) by mouth daily. 90 tablet 1   topiramate (TOPAMAX) 50 MG tablet Take 1 tablet (50 mg total) by mouth 2 (two) times daily. 180 tablet 1   Vitamin D, Ergocalciferol, (DRISDOL) 1.25 MG (50000 UNIT) CAPS capsule Take 1 capsule (50,000 Units total) by mouth every 7 (seven) days. 12 capsule 1   No current facility-administered  medications on file prior to visit.   Past Medical History:  Diagnosis Date   Arthritis    Depression    GERD (gastroesophageal reflux disease)    Hypertension    OSA (obstructive sleep apnea)    Past Surgical History:  Procedure Laterality Date   BACK SURGERY  2012   CARDIAC CATHETERIZATION     negative study no CAD   CESAREAN SECTION     FLEXOR TENOTOMY  Left 12/19/2021   Procedure: LEFT BRACHIORADIALIS RELEASE;  Surgeon: Betha Loa, MD;  Location: [facility];  Service: Orthopedics;  Laterality: Left;  Block   HERNIA REPAIR     LAPAROSCOPIC GASTRIC SLEEVE RESECTION     OPEN REDUCTION INTERNAL FIXATION (ORIF) DISTAL RADIAL FRACTURE Left 12/19/2021   Procedure: OPEN REDUCTION INTERNAL FIXATION (ORIF) LEFT DISTAL RADIUS FRACTURE;  Surgeon: Betha Loa, MD;  Location: [facility];  Service: Orthopedics;  Laterality: Left;  Block    Family History  Problem Relation Age of Onset   Arthritis Mother    Hypertension Mother    Cancer Mother        VULVAR CANCER   Diabetes Mother    Hyperlipidemia Mother    Hypothyroidism Mother    Arthritis Father    Hypertension Father    Hyperlipidemia Father    Breast cancer Maternal Aunt    Heart attack Paternal Grandfather    Social History   Socioeconomic History   Marital status: Single    Spouse name: Not on file   Number of children: 1   Years of education: Not on file   Highest education level: Bachelor's degree (e.g., BA, AB, BS)  Occupational History   Not on file  Tobacco Use   Smoking status: Never   Smokeless tobacco: Never  Vaping Use   Vaping status: Never Used  Substance and Sexual Activity   Alcohol use: Yes    Comment: 1 drink 1-2 times weekly   Drug use: Never   Sexual activity: Not on file  Other Topics Concern   Not on file  Social History Narrative   Not on file   Social Drivers of Health   Financial Resource Strain: Low Risk  (01/04/2023)   Overall Financial Resource Strain  (CARDIA)    Difficulty of Paying Living Expenses: Not very hard  Food Insecurity: No Food Insecurity (01/04/2023)   Hunger Vital Sign    Worried About Running Out of Food in the Last Year: Never true    Ran Out of Food in the Last Year: Never true  Transportation Needs: No Transportation Needs (01/04/2023)   PRAPARE - Administrator, Civil Service (Medical): No    Lack of Transportation (Non-Medical): No  Physical Activity: Inactive (  05/12/2023)   Exercise Vital Sign    Days of Exercise per Week: 0 days    Minutes of Exercise per Session: 0 min  Stress: Stress Concern Present (01/04/2023)   [employer] of Occupational Health - Occupational Stress Questionnaire    Feeling of Stress : To some extent  Social Connections: Socially Isolated (01/04/2023)   Social Connection and Isolation Panel [NHANES]    Frequency of Communication with Friends and Family: Once a week    Frequency of Social Gatherings with Friends and Family: Once a week    Attends Religious Services: Never    Database administrator or Organizations: No    Attends Engineer, structural: Not on file    Marital Status: Never married   CONSTITUTIONAL: Negative for chills, fatigue, fever, unintentional weight gain and unintentional weight loss.  E/N/T: Negative for ear pain, nasal congestion and sore throat.  CARDIOVASCULAR: Negative for chest pain, dizziness, palpitations and pedal edema.  RESPIRATORY: Negative for recent cough and dyspnea.  GASTROINTESTINAL: Negative for abdominal pain, acid reflux symptoms, constipation, diarrhea, nausea and vomiting.  MSK: Negative for arthralgias and myalgias.  INTEGUMENTARY:see HPI NEUROLOGICAL: Negative for dizziness and headaches.  PSYCHIATRIC: Negative for sleep disturbance and to question depression screen.  Negative for depression, negative for anhedonia.       Objective:  PHYSICAL EXAM:   VS: BP 110/80   Pulse 80   Temp 98 F (36.7 C) (Temporal)   Resp  18   Ht 5\' 2"  (1.575 m)   Wt 269 lb 6.4 oz (122.2 kg)   SpO2 99%   BMI 49.27 kg/m   GEN: Well nourished, well developed, in no acute distress   Cardiac: RRR; no murmurs, rubs, or gallops,no edema -  Respiratory:  normal respiratory rate and pattern with no distress - normal breath sounds with no rales, rhonchi, wheezes or rubs  MS: no deformity or atrophy  Skin: slight rash on corners of mouth Neuro:  Alert and Oriented x 3, Strength and sensation are intact - CN II-Xii grossly intact Psych: euthymic mood, appropriate affect and demeanor   Lab Results  Component Value Date   WBC 8.7 05/12/2023   HGB 14.1 05/12/2023   HCT 42.1 05/12/2023   PLT 355 05/12/2023   GLUCOSE 84 05/12/2023   CHOL 197 05/12/2023   TRIG 223 (H) 05/12/2023   HDL 52 05/12/2023   LDLCALC 107 (H) 05/12/2023   ALT 40 (H) 05/12/2023   AST 30 05/12/2023   NA 142 05/12/2023   K 4.4 05/12/2023   CL 104 05/12/2023   CREATININE 0.74 05/12/2023   BUN 15 05/12/2023   CO2 22 05/12/2023   TSH 1.730 05/12/2023      Assessment & Plan:   Problem List Items Addressed This Visit       Cardiovascular and Mediastinum   Essential hypertension   Relevant Orders   CBC with Differential/Platelet   Comprehensive metabolic panel Continue meds             Other   Depression, recurrent (HCC)   Relevant Orders   Continue prozac   Mixed hyperlipidemia - Primary   Relevant Orders   Lipid panel Watch diet Continue crestor 5mg    Other Visit Diagnoses     Vitamin D deficiency       Relevant Medications   Vitamin D, Ergocalciferol, (DRISDOL) 1.25 MG (50000 UNIT) CAPS capsule   Other Relevant Orders   VITAMIN D 25 Hydroxy (Vit-D Deficiency, Fractures)  Gastroesophageal reflux disease without esophagitis       Relevant Medications   esomeprazole (NEXIUM) 40 MG capsule   Intertrigo Rx lotrisone  Angular chelitis Rx nizoral cream  Fatigue  Labwork pending      Allergic rhinitis Continue current  meds  Chronic low back pain with sciatica Continue gabapentin           .  No orders of the defined types were placed in this encounter.   No orders of the defined types were placed in this encounter.    Follow-up: Return in about 6 months (around 05/11/2024) for chronic fasting follow-up.  An After Visit Summary was printed and given to the patient.  Jettie Pagan [facility] [phone]

## 2023-11-11 LAB — CBC WITH DIFFERENTIAL/PLATELET
Basophils Absolute: 0 10*3/uL (ref 0.0–0.2)
Basos: 1 %
EOS (ABSOLUTE): 0.1 10*3/uL (ref 0.0–0.4)
Eos: 2 %
Hematocrit: 40.4 % (ref 34.0–46.6)
Hemoglobin: 13.7 g/dL (ref 11.1–15.9)
Immature Grans (Abs): 0 10*3/uL (ref 0.0–0.1)
Immature Granulocytes: 0 %
Lymphocytes Absolute: 2.5 10*3/uL (ref 0.7–3.1)
Lymphs: 38 %
MCH: 32 pg (ref 26.6–33.0)
MCHC: 33.9 g/dL (ref 31.5–35.7)
MCV: 94 fL (ref 79–97)
Monocytes Absolute: 0.5 10*3/uL (ref 0.1–0.9)
Monocytes: 7 %
Neutrophils Absolute: 3.5 10*3/uL (ref 1.4–7.0)
Neutrophils: 52 %
Platelets: 331 10*3/uL (ref 150–450)
RBC: 4.28 x10E6/uL (ref 3.77–5.28)
RDW: 12.6 % (ref 11.7–15.4)
WBC: 6.6 10*3/uL (ref 3.4–10.8)

## 2023-11-11 LAB — COMPREHENSIVE METABOLIC PANEL WITH GFR
ALT: 66 IU/L — ABNORMAL HIGH (ref 0–32)
AST: 42 IU/L — ABNORMAL HIGH (ref 0–40)
Albumin: 4.4 g/dL (ref 3.9–4.9)
Alkaline Phosphatase: 89 IU/L (ref 44–121)
BUN/Creatinine Ratio: 17 (ref 9–23)
BUN: 13 mg/dL (ref 6–24)
Bilirubin Total: 0.3 mg/dL (ref 0.0–1.2)
CO2: 21 mmol/L (ref 20–29)
Calcium: 9.3 mg/dL (ref 8.7–10.2)
Chloride: 106 mmol/L (ref 96–106)
Creatinine, Ser: 0.77 mg/dL (ref 0.57–1.00)
Globulin, Total: 2.2 g/dL (ref 1.5–4.5)
Glucose: 85 mg/dL (ref 70–99)
Potassium: 4.3 mmol/L (ref 3.5–5.2)
Sodium: 140 mmol/L (ref 134–144)
Total Protein: 6.6 g/dL (ref 6.0–8.5)
eGFR: 96 mL/min/{1.73_m2} (ref >59)

## 2023-11-11 LAB — IRON,TIBC AND FERRITIN PANEL
Ferritin: 454 ng/mL — ABNORMAL HIGH (ref 15–150)
Iron Saturation: 24 % (ref 15–55)
Iron: 71 ug/dL (ref 27–159)
Total Iron Binding Capacity: 298 ug/dL (ref 250–450)
UIBC: 227 ug/dL (ref 131–425)

## 2023-11-11 LAB — TSH: TSH: 2.2 u[IU]/mL (ref 0.450–4.500)

## 2023-11-11 LAB — LIPID PANEL
Chol/HDL Ratio: 3.9 ratio (ref 0.0–4.4)
Cholesterol, Total: 185 mg/dL (ref 100–199)
HDL: 47 mg/dL (ref >39)
LDL Chol Calc (NIH): 107 mg/dL — ABNORMAL HIGH (ref 0–99)
Triglycerides: 176 mg/dL — ABNORMAL HIGH (ref 0–149)
VLDL Cholesterol Cal: 31 mg/dL (ref 5–40)

## 2023-11-11 LAB — B12 AND FOLATE PANEL
Folate: >20 ng/mL (ref >3.0)
Vitamin B-12: 1673 pg/mL — ABNORMAL HIGH (ref 232–1245)

## 2023-11-11 LAB — VITAMIN D 25 HYDROXY (VIT D DEFICIENCY, FRACTURES): Vit D, 25-Hydroxy: 63 ng/mL (ref 30.0–100.0)

## 2023-11-16 ENCOUNTER — Other Ambulatory Visit: Payer: Self-pay | Admitting: Physician Assistant

## 2023-11-16 DIAGNOSIS — R899 Unspecified abnormal finding in specimens from other organs, systems and tissues: Secondary | ICD-10-CM

## 2023-12-17 ENCOUNTER — Other Ambulatory Visit

## 2023-12-17 DIAGNOSIS — R899 Unspecified abnormal finding in specimens from other organs, systems and tissues: Secondary | ICD-10-CM

## 2023-12-18 LAB — CBC WITH DIFFERENTIAL/PLATELET
Basophils Absolute: 0.1 10*3/uL (ref 0.0–0.2)
Basos: 1 %
EOS (ABSOLUTE): 0.2 10*3/uL (ref 0.0–0.4)
Eos: 2 %
Hematocrit: 44.8 % (ref 34.0–46.6)
Hemoglobin: 14.7 g/dL (ref 11.1–15.9)
Immature Grans (Abs): 0 10*3/uL (ref 0.0–0.1)
Immature Granulocytes: 0 %
Lymphocytes Absolute: 3.4 10*3/uL — ABNORMAL HIGH (ref 0.7–3.1)
Lymphs: 40 %
MCH: 30.9 pg (ref 26.6–33.0)
MCHC: 32.8 g/dL (ref 31.5–35.7)
MCV: 94 fL (ref 79–97)
Monocytes Absolute: 0.5 10*3/uL (ref 0.1–0.9)
Monocytes: 6 %
Neutrophils Absolute: 4.3 10*3/uL (ref 1.4–7.0)
Neutrophils: 51 %
Platelets: 351 10*3/uL (ref 150–450)
RBC: 4.75 x10E6/uL (ref 3.77–5.28)
RDW: 12.9 % (ref 11.7–15.4)
WBC: 8.5 10*3/uL (ref 3.4–10.8)

## 2023-12-18 LAB — B12 AND FOLATE PANEL
Folate: >20 ng/mL (ref >3.0)
Vitamin B-12: 1475 pg/mL — ABNORMAL HIGH (ref 232–1245)

## 2023-12-18 LAB — IRON,TIBC AND FERRITIN PANEL
Ferritin: 462 ng/mL — ABNORMAL HIGH (ref 15–150)
Iron Saturation: 23 % (ref 15–55)
Iron: 72 ug/dL (ref 27–159)
Total Iron Binding Capacity: 307 ug/dL (ref 250–450)
UIBC: 235 ug/dL (ref 131–425)

## 2023-12-20 ENCOUNTER — Other Ambulatory Visit: Payer: Self-pay | Admitting: Physician Assistant

## 2023-12-20 DIAGNOSIS — E559 Vitamin D deficiency, unspecified: Secondary | ICD-10-CM

## 2023-12-21 ENCOUNTER — Ambulatory Visit: Payer: Self-pay | Admitting: Physician Assistant

## 2023-12-24 ENCOUNTER — Other Ambulatory Visit: Payer: Self-pay | Admitting: Physician Assistant

## 2023-12-24 DIAGNOSIS — Z6841 Body Mass Index (BMI) 40.0 and over, adult: Secondary | ICD-10-CM

## 2024-01-30 ENCOUNTER — Other Ambulatory Visit: Payer: Self-pay | Admitting: Physician Assistant

## 2024-01-30 DIAGNOSIS — K219 Gastro-esophageal reflux disease without esophagitis: Secondary | ICD-10-CM

## 2024-01-30 DIAGNOSIS — E782 Mixed hyperlipidemia: Secondary | ICD-10-CM

## 2024-01-30 DIAGNOSIS — J3089 Other allergic rhinitis: Secondary | ICD-10-CM

## 2024-02-06 ENCOUNTER — Other Ambulatory Visit: Payer: Self-pay | Admitting: Physician Assistant

## 2024-02-06 DIAGNOSIS — I10 Essential (primary) hypertension: Secondary | ICD-10-CM

## 2024-05-07 ENCOUNTER — Other Ambulatory Visit: Payer: Self-pay | Admitting: Physician Assistant

## 2024-05-11 ENCOUNTER — Encounter: Payer: Self-pay | Admitting: Physician Assistant

## 2024-05-11 ENCOUNTER — Ambulatory Visit (INDEPENDENT_AMBULATORY_CARE_PROVIDER_SITE_OTHER): Admitting: Physician Assistant

## 2024-05-11 VITALS — BP 116/80 | HR 90 | Temp 98.2°F | Resp 18 | Ht 62.0 in | Wt 271.8 lb

## 2024-05-11 DIAGNOSIS — E782 Mixed hyperlipidemia: Secondary | ICD-10-CM

## 2024-05-11 DIAGNOSIS — F339 Major depressive disorder, recurrent, unspecified: Secondary | ICD-10-CM

## 2024-05-11 DIAGNOSIS — K219 Gastro-esophageal reflux disease without esophagitis: Secondary | ICD-10-CM

## 2024-05-11 DIAGNOSIS — I10 Essential (primary) hypertension: Secondary | ICD-10-CM | POA: Diagnosis not present

## 2024-05-11 DIAGNOSIS — E559 Vitamin D deficiency, unspecified: Secondary | ICD-10-CM | POA: Diagnosis not present

## 2024-05-11 DIAGNOSIS — R899 Unspecified abnormal finding in specimens from other organs, systems and tissues: Secondary | ICD-10-CM

## 2024-05-11 MED ORDER — FLUOXETINE HCL 40 MG PO CAPS: 40.0000 mg | ORAL_CAPSULE | Freq: Every day | ORAL | 1 refills | Status: DC

## 2024-05-11 NOTE — Progress Notes (Signed)
 Subjective:  Patient ID: Natalie Hoffman, female    DOB: June 02, 1976  Age: 48 y.o. MRN: [id number]  Chief Complaint  Patient presents with   Medical Management of Chronic Issues    Hypertension  Depression         Pt presents for follow up of hypertension.  The patient is tolerating the medication well without side effects. Compliance with treatment has been good; including taking medication as directed  , and following up as directed. Currently on losartan  100mg  qd Denies chest pain/dyspnea  Pt with history of GERD - currently on nexium  40mg  qd and stable   Mixed hyperlipidemia  Pt presents with hyperlipidemia.  Compliance with treatment has been good The patient is compliant with medications, maintains a low cholesterol diet , follows up as directed , The patient denies experiencing any hypercholesterolemia related symptoms. Currently on crestor  5 mg qd Is due for labwork  Pt with history of vit D def - stable on current supplement and due for labwork  Pt with history of restless leg syndrome - currently on mirapex  0.5mg  - she states that helps with restless legs symptoms   Pt with history of depression/anxiety - she has tried multiple medications in the past and now on prozac  20mg  qd - she had been doing well on this dose but states she is beginning to have breakthrough symptoms - would benefit from medication adjustment She is still going to counseling in person every 2 weeks  Pt is following with [facility] weight management clinic for obesity.  She had a gastric sleeve 12/22 but continues to struggle with weight - she is currently taking topamax  50mg  at bedtime and had been losing weight but now struggling again - would like to increase dose  Pt due to recheck iron levels and B12 levels - last labwork was abnormal - she has stopped iron supplements except in her multivitamin - she does have B12 and B6 in multivitamin as well  Current Outpatient Medications on File  Prior to Visit  Medication Sig Dispense Refill   clotrimazole -betamethasone  (LOTRISONE ) cream Apply bid to rash under breasts for up to one week 30 g 2   esomeprazole  (NEXIUM ) 40 MG capsule TAKE 1 CAPSULE (40 MG TOTAL) BY MOUTH DAILY AT 12 NOON. 90 capsule 1   fluocinonide  cream (LIDEX ) 0.05 % Apply 1 Application topically 2 (two) times daily. 30 g 2   fluticasone  (FLONASE ) 50 MCG/ACT nasal spray Place 2 sprays into both nostrils daily. 16 g 6   gabapentin (NEURONTIN) 300 MG capsule Take 300 mg by mouth 2 (two) times daily.     Inulin (FIBER CHOICE PO) Take by mouth.     ketoconazole  (NIZORAL ) 2 % cream Apply bid to corners of mouth up to one week 15 g 1   levocetirizine (XYZAL ) 5 MG tablet TAKE 1 TABLET BY MOUTH EVERY DAY IN THE EVENING 90 tablet 1   losartan  (COZAAR ) 100 MG tablet TAKE 1 TABLET BY MOUTH EVERY DAY 90 tablet 0   Magnesium  Oxide (MAG-OX PO) Take by mouth.     montelukast  (SINGULAIR ) 10 MG tablet TAKE 1 TABLET BY MOUTH EVERYDAY AT BEDTIME 90 tablet 1   Multiple Vitamins-Minerals (BARIATRIC MULTIVITAMINS/IRON) CAPS Take by mouth.     Omega-3 Fatty Acids (FISH OIL PO) Take by mouth.     pramipexole  (MIRAPEX ) 0.5 MG tablet Take 1 tablet (0.5 mg total) by mouth 3 (three) times daily. 270 tablet 1   rosuvastatin  (CRESTOR ) 5 MG tablet TAKE  1 TABLET (5 MG TOTAL) BY MOUTH DAILY. 90 tablet 1   topiramate  (TOPAMAX ) 50 MG tablet TAKE 1 TABLET BY MOUTH TWICE A DAY 180 tablet 1   Vitamin D , Ergocalciferol , (DRISDOL ) 1.25 MG (50000 UNIT) CAPS capsule TAKE 1 CAPSULE (50,000 UNITS TOTAL) BY MOUTH EVERY 7 (SEVEN) DAYS 12 capsule 1   No current facility-administered medications on file prior to visit.   Past Medical History:  Diagnosis Date   Arthritis    Depression    GERD (gastroesophageal reflux disease)    Hypertension    OSA (obstructive sleep apnea)    Past Surgical History:  Procedure Laterality Date   BACK SURGERY  2012   CARDIAC CATHETERIZATION     negative study no CAD    CESAREAN SECTION     FLEXOR TENOTOMY  Left 12/19/2021   Procedure: LEFT BRACHIORADIALIS RELEASE;  Surgeon: Murrell Drivers, MD;  Location: [facility];  Service: Orthopedics;  Laterality: Left;  Block   HERNIA REPAIR     LAPAROSCOPIC GASTRIC SLEEVE RESECTION     OPEN REDUCTION INTERNAL FIXATION (ORIF) DISTAL RADIAL FRACTURE Left 12/19/2021   Procedure: OPEN REDUCTION INTERNAL FIXATION (ORIF) LEFT DISTAL RADIUS FRACTURE;  Surgeon: Murrell Drivers, MD;  Location: [facility];  Service: Orthopedics;  Laterality: Left;  Block    Family History  Problem Relation Age of Onset   Arthritis Mother    Hypertension Mother    Cancer Mother        VULVAR CANCER   Diabetes Mother    Hyperlipidemia Mother    Hypothyroidism Mother    Arthritis Father    Hypertension Father    Hyperlipidemia Father    Breast cancer Maternal Aunt    Heart attack Paternal Grandfather    Social History   Socioeconomic History   Marital status: Single    Spouse name: Not on file   Number of children: 1   Years of education: Not on file   Highest education level: Bachelor's degree (e.g., BA, AB, BS)  Occupational History   Not on file  Tobacco Use   Smoking status: Never   Smokeless tobacco: Never  Vaping Use   Vaping status: Never Used  Substance and Sexual Activity   Alcohol use: Yes    Comment: 1 drink 1-2 times weekly   Drug use: Never   Sexual activity: Not on file  Other Topics Concern   Not on file  Social History Narrative   Not on file   Social Drivers of Health   Financial Resource Strain: Low Risk  (01/04/2023)   Overall Financial Resource Strain (CARDIA)    Difficulty of Paying Living Expenses: Not very hard  Food Insecurity: No Food Insecurity (01/04/2023)   Hunger Vital Sign    Worried About Running Out of Food in the Last Year: Never true    Ran Out of Food in the Last Year: Never true  Transportation Needs: No Transportation Needs (01/04/2023)   PRAPARE -  Administrator, Civil Service (Medical): No    Lack of Transportation (Non-Medical): No  Physical Activity: Inactive (05/12/2023)   Exercise Vital Sign    Days of Exercise per Week: 0 days    Minutes of Exercise per Session: 0 min  Stress: Stress Concern Present (01/04/2023)   [employer] of Occupational Health - Occupational Stress Questionnaire    Feeling of Stress : To some extent  Social Connections: Socially Isolated (01/04/2023)   Social Connection and Isolation Panel  Frequency of Communication with Friends and Family: Once a week    Frequency of Social Gatherings with Friends and Family: Once a week    Attends Religious Services: Never    Database administrator or Organizations: No    Attends Engineer, structural: Not on file    Marital Status: Never married   CONSTITUTIONAL: Negative for chills, fatigue, fever, unintentional weight gain and unintentional weight loss.  E/N/T: Negative for ear pain, nasal congestion and sore throat.  CARDIOVASCULAR: Negative for chest pain, dizziness, palpitations and pedal edema.  RESPIRATORY: Negative for recent cough and dyspnea.  GASTROINTESTINAL: Negative for abdominal pain, acid reflux symptoms, constipation, diarrhea, nausea and vomiting.  MSK: see HPI INTEGUMENTARY: is troubled with eczema and yeast at times NEUROLOGICAL: Negative for dizziness and headaches.  PSYCHIATRIC: Negative for sleep disturbance and to question depression screen.  Negative for depression, negative for anhedonia.       Objective:  PHYSICAL EXAM:   VS: BP 116/80   Pulse 90   Temp 98.2 F (36.8 C) (Temporal)   Resp 18   Ht 5' 2 (1.575 m)   Wt 271 lb 12.8 oz (123.3 kg)   SpO2 96%   BMI 49.71 kg/m   GEN: Well nourished, well developed, in no acute distress - obese  Cardiac: RRR; no murmurs, rubs, or gallops,no edema -  Respiratory:  normal respiratory rate and pattern with no distress - normal breath sounds with no rales,  rhonchi, wheezes or rubs  MS: no deformity or atrophy  Skin: warm and dry, no rash  Neuro:  Alert and Oriented x 3, - CN II-Xii grossly intact Psych: euthymic mood, appropriate affect and demeanor  Lab Results  Component Value Date   WBC 8.5 12/17/2023   HGB 14.7 12/17/2023   HCT 44.8 12/17/2023   PLT 351 12/17/2023   GLUCOSE 85 11/10/2023   CHOL 185 11/10/2023   TRIG 176 (H) 11/10/2023   HDL 47 11/10/2023   LDLCALC 107 (H) 11/10/2023   ALT 66 (H) 11/10/2023   AST 42 (H) 11/10/2023   NA 140 11/10/2023   K 4.3 11/10/2023   CL 106 11/10/2023   CREATININE 0.77 11/10/2023   BUN 13 11/10/2023   CO2 21 11/10/2023   TSH 2.200 11/10/2023      Assessment & Plan:   Problem List Items Addressed This Visit       Cardiovascular and Mediastinum   Essential hypertension   Relevant Orders   CBC with Differential/Platelet   Comprehensive metabolic panel Continue meds             Other   Depression, recurrent (HCC)   Relevant Orders   Continue prozac  but increase dose to 40mg  qd Continue to follow with counselor   Mixed hyperlipidemia - Primary   Relevant Orders   Lipid panel Watch diet Continue crestor  5mg    Other Visit Diagnoses     Vitamin D  deficiency       Relevant Medications   Vitamin D , Ergocalciferol , (DRISDOL ) 1.25 MG (50000 UNIT) CAPS capsule   Other Relevant Orders   VITAMIN D  25 Hydroxy (Vit-D Deficiency, Fractures)   Gastroesophageal reflux disease without esophagitis       Relevant Medications   esomeprazole  (NEXIUM ) 40 MG capsule   Abnormal lab results B12 and iron studies pending           .  Meds ordered this encounter  Medications   FLUoxetine  (PROZAC ) 40 MG capsule    Sig: Take  1 capsule (40 mg total) by mouth daily.    Dispense:  30 capsule    Refill:  1    Supervising Provider:   COX, KIRSTEN [phone]    Orders Placed This Encounter  Procedures   Comprehensive metabolic panel with GFR   Lipid panel   B12 and Folate Panel    Fe+CBC/D/Plt+TIBC+Fer+Retic   VITAMIN D  25 Hydroxy (Vit-D Deficiency, Fractures)   TSH     Follow-up: Return in about 6 months (around 11/09/2024) for chronic fasting follow-up.  An After Visit Summary was printed and given to the patient.  CAMIE JONELLE NICHOLAUS DEVONNA [facility] [phone]

## 2024-05-12 ENCOUNTER — Ambulatory Visit: Payer: Self-pay | Admitting: Physician Assistant

## 2024-05-12 LAB — FE+CBC/D/PLT+TIBC+FER+RETIC
Basophils Absolute: 0.1 x10E3/uL (ref 0.0–0.2)
Basos: 1 %
EOS (ABSOLUTE): 0.2 x10E3/uL (ref 0.0–0.4)
Eos: 2 %
Ferritin: 316 ng/mL — ABNORMAL HIGH (ref 15–150)
Hematocrit: 44.1 % (ref 34.0–46.6)
Hemoglobin: 14.1 g/dL (ref 11.1–15.9)
Immature Grans (Abs): 0 x10E3/uL (ref 0.0–0.1)
Immature Granulocytes: 0 %
Iron Saturation: 18 % (ref 15–55)
Iron: 55 ug/dL (ref 27–159)
Lymphocytes Absolute: 3.2 x10E3/uL — ABNORMAL HIGH (ref 0.7–3.1)
Lymphs: 35 %
MCH: 31.1 pg (ref 26.6–33.0)
MCHC: 32 g/dL (ref 31.5–35.7)
MCV: 97 fL (ref 79–97)
Monocytes Absolute: 0.7 x10E3/uL (ref 0.1–0.9)
Monocytes: 7 %
Neutrophils Absolute: 5.1 x10E3/uL (ref 1.4–7.0)
Neutrophils: 55 %
Platelets: 339 x10E3/uL (ref 150–450)
RBC: 4.53 x10E6/uL (ref 3.77–5.28)
RDW: 12.4 % (ref 11.7–15.4)
Retic Ct Pct: 1.2 % (ref 0.6–2.6)
Total Iron Binding Capacity: 306 ug/dL (ref 250–450)
UIBC: 251 ug/dL (ref 131–425)
WBC: 9.2 x10E3/uL (ref 3.4–10.8)

## 2024-05-12 LAB — LIPID PANEL
Chol/HDL Ratio: 3.5 ratio (ref 0.0–4.4)
Cholesterol, Total: 193 mg/dL (ref 100–199)
HDL: 55 mg/dL (ref >39)
LDL Chol Calc (NIH): 110 mg/dL — ABNORMAL HIGH (ref 0–99)
Triglycerides: 159 mg/dL — ABNORMAL HIGH (ref 0–149)
VLDL Cholesterol Cal: 28 mg/dL (ref 5–40)

## 2024-05-12 LAB — COMPREHENSIVE METABOLIC PANEL WITH GFR
ALT: 88 IU/L — ABNORMAL HIGH (ref 0–32)
AST: 47 IU/L — ABNORMAL HIGH (ref 0–40)
Albumin: 4.3 g/dL (ref 3.9–4.9)
Alkaline Phosphatase: 99 IU/L (ref 41–116)
BUN/Creatinine Ratio: 18 (ref 9–23)
BUN: 15 mg/dL (ref 6–24)
Bilirubin Total: 0.3 mg/dL (ref 0.0–1.2)
CO2: 21 mmol/L (ref 20–29)
Calcium: 9.4 mg/dL (ref 8.7–10.2)
Chloride: 106 mmol/L (ref 96–106)
Creatinine, Ser: 0.85 mg/dL (ref 0.57–1.00)
Globulin, Total: 2.6 g/dL (ref 1.5–4.5)
Glucose: 91 mg/dL (ref 70–99)
Potassium: 4 mmol/L (ref 3.5–5.2)
Sodium: 142 mmol/L (ref 134–144)
Total Protein: 6.9 g/dL (ref 6.0–8.5)
eGFR: 85 mL/min/1.73 (ref >59)

## 2024-05-12 LAB — B12 AND FOLATE PANEL
Folate: >20 ng/mL (ref >3.0)
Vitamin B-12: 1740 pg/mL — ABNORMAL HIGH (ref 232–1245)

## 2024-05-12 LAB — TSH: TSH: 2.46 u[IU]/mL (ref 0.450–4.500)

## 2024-05-12 LAB — VITAMIN D 25 HYDROXY (VIT D DEFICIENCY, FRACTURES): Vit D, 25-Hydroxy: 54 ng/mL (ref 30.0–100.0)

## 2024-05-13 NOTE — Telephone Encounter (Signed)
 Copied from CRM [id number]. Topic: Clinical - Medication Question >> May 13, 2024  3:50 PM Emylou G wrote: Reason for CRM: Patient called.. wants to know does she need to take multivitamin if she is stopping the other?

## 2024-05-30 ENCOUNTER — Telehealth: Payer: Self-pay | Admitting: Physician Assistant

## 2024-05-30 NOTE — Telephone Encounter (Signed)
 Called and left a voicemail as well as sent a MyChart message for patient to call the office to reschedule the appointment for 11/16/24 due to the provider being out of office.

## 2024-06-03 ENCOUNTER — Other Ambulatory Visit: Payer: Self-pay | Admitting: Physician Assistant

## 2024-06-03 DIAGNOSIS — F339 Major depressive disorder, recurrent, unspecified: Secondary | ICD-10-CM

## 2024-06-16 ENCOUNTER — Other Ambulatory Visit: Payer: Self-pay | Admitting: Physician Assistant

## 2024-06-16 DIAGNOSIS — E559 Vitamin D deficiency, unspecified: Secondary | ICD-10-CM

## 2024-06-22 ENCOUNTER — Other Ambulatory Visit: Payer: Self-pay | Admitting: Physician Assistant

## 2024-06-22 DIAGNOSIS — Z6841 Body Mass Index (BMI) 40.0 and over, adult: Secondary | ICD-10-CM

## 2024-06-27 ENCOUNTER — Encounter: Payer: Self-pay | Admitting: Physician Assistant

## 2024-06-28 ENCOUNTER — Other Ambulatory Visit: Payer: Self-pay | Admitting: Physician Assistant

## 2024-06-28 MED ORDER — ARIPIPRAZOLE 2 MG PO TABS: 2.0000 mg | ORAL_TABLET | Freq: Every day | ORAL | 0 refills | Status: DC

## 2024-07-17 ENCOUNTER — Other Ambulatory Visit: Payer: Self-pay | Admitting: Physician Assistant

## 2024-07-17 DIAGNOSIS — J3089 Other allergic rhinitis: Secondary | ICD-10-CM

## 2024-07-17 DIAGNOSIS — K219 Gastro-esophageal reflux disease without esophagitis: Secondary | ICD-10-CM

## 2024-07-17 DIAGNOSIS — E782 Mixed hyperlipidemia: Secondary | ICD-10-CM

## 2024-07-26 ENCOUNTER — Other Ambulatory Visit: Payer: Self-pay | Admitting: Physician Assistant

## 2024-08-13 ENCOUNTER — Other Ambulatory Visit: Payer: Self-pay | Admitting: Physician Assistant

## 2024-08-13 DIAGNOSIS — I10 Essential (primary) hypertension: Secondary | ICD-10-CM

## 2024-09-02 ENCOUNTER — Other Ambulatory Visit: Payer: Self-pay | Admitting: Physician Assistant

## 2024-09-02 DIAGNOSIS — F339 Major depressive disorder, recurrent, unspecified: Secondary | ICD-10-CM

## 2024-11-16 ENCOUNTER — Ambulatory Visit: Admitting: Physician Assistant
